# Patient Record
Sex: Female | Born: 1985 | Race: White | Hispanic: No | Smoking: Former smoker
Health system: Southern US, Community
[De-identification: ages and names within clinical notes are randomized; demographics above are authoritative.]

## PROBLEM LIST (undated history)

## (undated) ENCOUNTER — Inpatient Hospital Stay (HOSPITAL_COMMUNITY): Payer: Self-pay

## (undated) DIAGNOSIS — N201 Calculus of ureter: Secondary | ICD-10-CM

## (undated) DIAGNOSIS — R3915 Urgency of urination: Secondary | ICD-10-CM

## (undated) DIAGNOSIS — Z8619 Personal history of other infectious and parasitic diseases: Secondary | ICD-10-CM

## (undated) DIAGNOSIS — G43909 Migraine, unspecified, not intractable, without status migrainosus: Secondary | ICD-10-CM

## (undated) DIAGNOSIS — K219 Gastro-esophageal reflux disease without esophagitis: Secondary | ICD-10-CM

## (undated) HISTORY — DX: Migraine, unspecified, not intractable, without status migrainosus: G43.909

## (undated) HISTORY — PX: WISDOM TOOTH EXTRACTION: SHX21

---
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
## 2008-01-09 ENCOUNTER — Emergency Department (HOSPITAL_COMMUNITY): Admission: EM | Admit: 2008-01-09 | Discharge: 2008-01-09 | Payer: Self-pay | Admitting: Emergency Medicine

## 2008-03-24 ENCOUNTER — Emergency Department (HOSPITAL_COMMUNITY): Admission: EM | Admit: 2008-03-24 | Discharge: 2008-03-24 | Payer: Self-pay | Admitting: Emergency Medicine

## 2010-02-16 ENCOUNTER — Emergency Department (HOSPITAL_COMMUNITY)
Admission: EM | Admit: 2010-02-16 | Discharge: 2010-02-16 | Payer: Self-pay | Source: Home / Self Care | Admitting: Emergency Medicine

## 2010-02-16 LAB — URINALYSIS, ROUTINE W REFLEX MICROSCOPIC
Nitrite: NEGATIVE
Specific Gravity, Urine: 1.011 (ref 1.005–1.030)
pH: 7 (ref 5.0–8.0)

## 2010-05-05 LAB — DIFFERENTIAL
Eosinophils Absolute: 0.1 10*3/uL (ref 0.0–0.7)
Monocytes Absolute: 0.4 10*3/uL (ref 0.1–1.0)
Monocytes Relative: 6 % (ref 3–12)
Neutro Abs: 4.4 10*3/uL (ref 1.7–7.7)
Neutrophils Relative %: 67 % (ref 43–77)

## 2010-05-05 LAB — COMPREHENSIVE METABOLIC PANEL
Albumin: 3.7 g/dL (ref 3.5–5.2)
Alkaline Phosphatase: 105 U/L (ref 39–117)
BUN: 6 mg/dL (ref 6–23)
CO2: 24 mEq/L (ref 19–32)
Calcium: 9.2 mg/dL (ref 8.4–10.5)
Chloride: 109 mEq/L (ref 96–112)
Creatinine, Ser: 0.56 mg/dL (ref 0.4–1.2)
GFR calc Af Amer: >60 mL/min (ref >60)
Glucose, Bld: 91 mg/dL (ref 70–99)

## 2010-05-05 LAB — CBC
HCT: 41.5 % (ref 36.0–46.0)
MCV: 89.5 fL (ref 78.0–100.0)
Platelets: 323 10*3/uL (ref 150–400)
RDW: 12.4 % (ref 11.5–15.5)

## 2010-05-05 LAB — LIPASE, BLOOD: Lipase: 32 U/L (ref 11–59)

## 2010-06-10 ENCOUNTER — Other Ambulatory Visit: Payer: Self-pay | Admitting: Obstetrics and Gynecology

## 2010-06-10 ENCOUNTER — Ambulatory Visit
Admission: RE | Admit: 2010-06-10 | Discharge: 2010-06-10 | Disposition: A | Discharge status: Home / Self Care | Payer: PRIVATE HEALTH INSURANCE | Source: Ambulatory Visit | Attending: Obstetrics and Gynecology | Admitting: Obstetrics and Gynecology

## 2010-08-01 ENCOUNTER — Emergency Department (HOSPITAL_COMMUNITY)
Admission: EM | Admit: 2010-08-01 | Discharge: 2010-08-01 | Disposition: A | Discharge status: Home / Self Care | Payer: PRIVATE HEALTH INSURANCE | Attending: Emergency Medicine | Admitting: Emergency Medicine

## 2010-08-01 ENCOUNTER — Emergency Department (HOSPITAL_COMMUNITY): Payer: PRIVATE HEALTH INSURANCE

## 2010-08-01 DIAGNOSIS — R109 Unspecified abdominal pain: Secondary | ICD-10-CM | POA: Insufficient documentation

## 2010-08-01 DIAGNOSIS — M549 Dorsalgia, unspecified: Secondary | ICD-10-CM | POA: Insufficient documentation

## 2010-08-01 DIAGNOSIS — Z87442 Personal history of urinary calculi: Secondary | ICD-10-CM | POA: Insufficient documentation

## 2010-08-01 DIAGNOSIS — R112 Nausea with vomiting, unspecified: Secondary | ICD-10-CM | POA: Insufficient documentation

## 2010-08-01 LAB — URINALYSIS, ROUTINE W REFLEX MICROSCOPIC
Leukocytes, UA: NEGATIVE
Nitrite: NEGATIVE
Protein, ur: NEGATIVE mg/dL
pH: 5.5 (ref 5.0–8.0)

## 2010-08-01 LAB — CBC
Hemoglobin: 12.8 g/dL (ref 12.0–15.0)
MCH: 30.2 pg (ref 26.0–34.0)
MCHC: 34.1 g/dL (ref 30.0–36.0)
RBC: 4.24 MIL/uL (ref 3.87–5.11)
RDW: 12.6 % (ref 11.5–15.5)
WBC: 10.1 10*3/uL (ref 4.0–10.5)

## 2010-08-01 LAB — DIFFERENTIAL
Eosinophils Absolute: 0.4 10*3/uL (ref 0.0–0.7)
Lymphs Abs: 1.5 10*3/uL (ref 0.7–4.0)
Neutro Abs: 7.8 10*3/uL — ABNORMAL HIGH (ref 1.7–7.7)

## 2010-08-01 LAB — BASIC METABOLIC PANEL
BUN: 8 mg/dL (ref 6–23)
CO2: 24 mEq/L (ref 19–32)
GFR calc Af Amer: >60 mL/min (ref >60)
Potassium: 3.6 mEq/L (ref 3.5–5.1)
Sodium: 137 mEq/L (ref 135–145)

## 2010-11-18 ENCOUNTER — Other Ambulatory Visit (HOSPITAL_COMMUNITY): Payer: Self-pay | Admitting: Gastroenterology

## 2010-11-18 DIAGNOSIS — R1084 Generalized abdominal pain: Secondary | ICD-10-CM

## 2010-11-18 DIAGNOSIS — R110 Nausea: Secondary | ICD-10-CM

## 2010-12-12 ENCOUNTER — Other Ambulatory Visit (HOSPITAL_COMMUNITY): Payer: PRIVATE HEALTH INSURANCE

## 2010-12-19 ENCOUNTER — Encounter (HOSPITAL_COMMUNITY)
Admission: RE | Admit: 2010-12-19 | Discharge: 2010-12-19 | Disposition: A | Discharge status: Home / Self Care | Payer: PRIVATE HEALTH INSURANCE | Source: Ambulatory Visit | Attending: Gastroenterology | Admitting: Gastroenterology

## 2010-12-19 DIAGNOSIS — R110 Nausea: Secondary | ICD-10-CM | POA: Insufficient documentation

## 2010-12-19 DIAGNOSIS — R1084 Generalized abdominal pain: Secondary | ICD-10-CM | POA: Insufficient documentation

## 2010-12-19 MED ORDER — TECHNETIUM TC 99M MEBROFENIN IV KIT
5.0000 | PACK | Freq: Once | INTRAVENOUS | Status: AC | PRN
  Administered 2010-12-19: 5 via INTRAVENOUS

## 2010-12-19 MED ORDER — SINCALIDE 5 MCG IJ SOLR
INTRAMUSCULAR | Status: AC
  Administered 2010-12-19: 1.45 ug via INTRAVENOUS
  Filled 2010-12-19: qty 5

## 2010-12-26 HISTORY — PX: COLONOSCOPY: SHX174

## 2010-12-26 HISTORY — PX: UPPER GASTROINTESTINAL ENDOSCOPY: SHX188

## 2010-12-27 ENCOUNTER — Observation Stay (HOSPITAL_COMMUNITY)
Admission: EM | Admit: 2010-12-27 | Discharge: 2010-12-29 | Disposition: A | Discharge status: Home / Self Care | Payer: PRIVATE HEALTH INSURANCE | Attending: General Surgery | Admitting: General Surgery

## 2010-12-27 ENCOUNTER — Encounter: Payer: Self-pay | Admitting: *Deleted

## 2010-12-27 ENCOUNTER — Telehealth (INDEPENDENT_AMBULATORY_CARE_PROVIDER_SITE_OTHER): Payer: Self-pay | Admitting: General Surgery

## 2010-12-27 ENCOUNTER — Emergency Department (HOSPITAL_COMMUNITY): Payer: PRIVATE HEALTH INSURANCE

## 2010-12-27 DIAGNOSIS — F411 Generalized anxiety disorder: Secondary | ICD-10-CM | POA: Insufficient documentation

## 2010-12-27 DIAGNOSIS — Z87442 Personal history of urinary calculi: Secondary | ICD-10-CM | POA: Insufficient documentation

## 2010-12-27 DIAGNOSIS — K828 Other specified diseases of gallbladder: Secondary | ICD-10-CM | POA: Diagnosis present

## 2010-12-27 DIAGNOSIS — R110 Nausea: Secondary | ICD-10-CM

## 2010-12-27 DIAGNOSIS — R109 Unspecified abdominal pain: Secondary | ICD-10-CM | POA: Insufficient documentation

## 2010-12-27 DIAGNOSIS — Z87891 Personal history of nicotine dependence: Secondary | ICD-10-CM | POA: Insufficient documentation

## 2010-12-27 DIAGNOSIS — Z9889 Other specified postprocedural states: Secondary | ICD-10-CM

## 2010-12-27 DIAGNOSIS — R1011 Right upper quadrant pain: Secondary | ICD-10-CM

## 2010-12-27 DIAGNOSIS — K811 Chronic cholecystitis: Principal | ICD-10-CM | POA: Insufficient documentation

## 2010-12-27 LAB — COMPREHENSIVE METABOLIC PANEL
AST: 22 U/L (ref 0–37)
CO2: 23 mEq/L (ref 19–32)
Chloride: 106 mEq/L (ref 96–112)
Creatinine, Ser: 0.67 mg/dL (ref 0.50–1.10)
GFR calc Af Amer: >90 mL/min (ref >90)
GFR calc non Af Amer: >90 mL/min (ref >90)
Glucose, Bld: 90 mg/dL (ref 70–99)
Total Bilirubin: 0.4 mg/dL (ref 0.3–1.2)

## 2010-12-27 LAB — CBC
MCH: 30.5 pg (ref 26.0–34.0)
MCV: 88.2 fL (ref 78.0–100.0)
Platelets: 271 10*3/uL (ref 150–400)
RDW: 12.5 % (ref 11.5–15.5)
WBC: 8.1 10*3/uL (ref 4.0–10.5)

## 2010-12-27 LAB — DIFFERENTIAL
Basophils Absolute: 0 10*3/uL (ref 0.0–0.1)
Eosinophils Absolute: 0.4 10*3/uL (ref 0.0–0.7)
Eosinophils Relative: 5 % (ref 0–5)

## 2010-12-27 MED ORDER — SODIUM CHLORIDE 0.9 % IV SOLN
INTRAVENOUS | Status: DC
  Administered 2010-12-27: 09:00:00 via INTRAVENOUS

## 2010-12-27 MED ORDER — ONDANSETRON HCL 4 MG/2ML IJ SOLN
4.0000 mg | Freq: Four times a day (QID) | INTRAMUSCULAR | Status: DC | PRN
  Administered 2010-12-27 – 2010-12-28 (×3): 4 mg via INTRAVENOUS
  Filled 2010-12-27 (×2): qty 2

## 2010-12-27 MED ORDER — HYDROMORPHONE HCL PF 2 MG/ML IJ SOLN
INTRAMUSCULAR | Status: AC
  Filled 2010-12-27: qty 1

## 2010-12-27 MED ORDER — OXYCODONE-ACETAMINOPHEN 5-325 MG PO TABS: 2.0000 | ORAL_TABLET | ORAL | Status: DC | PRN

## 2010-12-27 MED ORDER — PROMETHAZINE HCL 25 MG/ML IJ SOLN
25.0000 mg | Freq: Four times a day (QID) | INTRAMUSCULAR | Status: DC | PRN
  Administered 2010-12-27: 25 mg via INTRAVENOUS
  Filled 2010-12-27 (×2): qty 1

## 2010-12-27 MED ORDER — ONDANSETRON HCL 4 MG/2ML IJ SOLN
INTRAMUSCULAR | Status: AC
  Filled 2010-12-27: qty 2

## 2010-12-27 MED ORDER — KCL IN DEXTROSE-NACL 20-5-0.45 MEQ/L-%-% IV SOLN
INTRAVENOUS | Status: DC
  Filled 2010-12-27 (×3): qty 1000

## 2010-12-27 MED ORDER — FENTANYL CITRATE 0.05 MG/ML IJ SOLN
100.0000 ug | Freq: Once | INTRAMUSCULAR | Status: AC
  Administered 2010-12-27: 100 ug via INTRAVENOUS
  Filled 2010-12-27: qty 2

## 2010-12-27 MED ORDER — HYDROMORPHONE HCL PF 1 MG/ML IJ SOLN
0.5000 mg | INTRAMUSCULAR | Status: DC | PRN
  Administered 2010-12-27: 0.5 mg via INTRAVENOUS
  Administered 2010-12-27: 1 mg via INTRAVENOUS
  Administered 2010-12-28: 0.5 mg via INTRAVENOUS

## 2010-12-27 MED ORDER — FENTANYL CITRATE 0.05 MG/ML IJ SOLN
50.0000 ug | Freq: Once | INTRAMUSCULAR | Status: AC
  Administered 2010-12-27: 50 ug via INTRAVENOUS
  Filled 2010-12-27: qty 2

## 2010-12-27 MED ORDER — SODIUM CHLORIDE 0.9 % IV BOLUS (SEPSIS)
500.0000 mL | Freq: Once | INTRAVENOUS | Status: AC
  Administered 2010-12-27: 500 mL via INTRAVENOUS

## 2010-12-27 MED ORDER — ONDANSETRON HCL 4 MG/2ML IJ SOLN
4.0000 mg | Freq: Once | INTRAMUSCULAR | Status: AC
  Administered 2010-12-27: 4 mg via INTRAVENOUS
  Filled 2010-12-27: qty 2

## 2010-12-27 NOTE — Telephone Encounter (Signed)
Rosey Bath called in to confirm daughter's appt for tomorrow with Dr. Biagio Quint.

## 2010-12-27 NOTE — ED Notes (Signed)
Patient denies pain and is resting comfortably.  

## 2010-12-27 NOTE — ED Provider Notes (Signed)
History     CSN: [id number] Arrival date & time: 12/27/2010  7:29 AM   First MD Initiated Contact with Patient 12/27/10 [phone]      Chief Complaint  Patient presents with  . Abdominal Pain    (Consider location/radiation/quality/duration/timing/severity/associated sxs/prior treatment) HPI Pt's mother states "she had endo & colonscopy yesterday, has an appt with a surgeon tomorrow for consultation of gallbladder removal d/t only functioning @ 6%.  Patient denies fever does have severe nausea and pain primarily in the right upper quadrant with some referral to the back area.  Patient has had no vomiting of blood or blood in the stools.  Past Medical History  Diagnosis Date  . Kidney stones     History reviewed. No pertinent past surgical history.  No family history on file.  History  Substance Use Topics  . Smoking status: Former Games developer  . Smokeless tobacco: Not on file  . Alcohol Use: No    OB History    Grav Para Term Preterm Abortions TAB SAB Ect Mult Living                  Review of Systems  All other systems reviewed and are negative.    Allergies  Augmentin; Morphine and related; and Penicillins  Home Medications   Current Outpatient Rx  Name Route Sig Dispense Refill  . CITALOPRAM HYDROBROMIDE 20 MG PO TABS Oral Take 20 mg by mouth daily.      . DEXLANSOPRAZOLE 60 MG PO CPDR Oral Take 60 mg by mouth daily.      Marland Kitchen LORATADINE 10 MG PO TABS Oral Take 10 mg by mouth daily.      Marland Kitchen NORGESTIMATE-ETH ESTRADIOL 0.25-35 MG-MCG PO TABS Oral Take 1 tablet by mouth daily.        BP 107/77  Pulse 72  Temp(Src) 97.8 F (36.6 C) (Oral)  Resp 16  Wt 155 lb (70.308 kg)  SpO2 100%  LMP 12/06/2010  Physical Exam  Nursing note and vitals reviewed. Constitutional: She is oriented to person, place, and time. She appears well-developed and well-nourished. No distress.  HENT:  Head: Normocephalic and atraumatic.  Eyes: Pupils are equal, round, and reactive to light.  No scleral icterus.  Neck: Normal range of motion.  Cardiovascular: Normal rate and intact distal pulses.   Pulmonary/Chest: No respiratory distress.  Abdominal: Normal appearance. She exhibits no distension. There is tenderness.    Musculoskeletal: Normal range of motion.  Neurological: She is alert and oriented to person, place, and time. No cranial nerve deficit.  Skin: Skin is warm and dry. No rash noted. She is not diaphoretic.  Psychiatric: She has a normal mood and affect. Her behavior is normal.    ED Course  Procedures (including critical care time) Patient was given IV fluids and pain and antinausea medicine had some improvement in her symptoms.  Family wanted her evaluated by surgery.  Surgery contacted and they will come to evaluate him probably admit patient from the ED  Labs Reviewed  COMPREHENSIVE METABOLIC PANEL  LIPASE, BLOOD  CBC  DIFFERENTIAL   Dg Chest 1 View  12/27/2010  *RADIOLOGY REPORT*  Clinical Data: Upper abdominal pain and gas following GI procedure yesterday.  CHEST - 1 VIEW  Comparison:  Two-view chest radiograph 03/24/2008 at [facility].  Findings:  The heart size is normal.  The lungs are clear.  The visualized soft tissues and bony thorax are unremarkable.  There is no free air under the diaphragm.  IMPRESSION: Negative chest.  Original Report Authenticated By: Jamesetta Orleans. MATTERN, M.D.     No diagnosis found.    MDM          Nelia Shi, MD 12/27/10 1016

## 2010-12-27 NOTE — H&P (Signed)
Seen, agree with above. Pt with biliary dyskinesia.  Plan lap chole as schedule allows.

## 2010-12-27 NOTE — ED Notes (Signed)
Pt's mother states "she had endo & colonscopy yesterday, has an appt with a surgeon tomorrow for consultation of gallbladder removal d/t only functioning @ 6%"

## 2010-12-27 NOTE — H&P (Signed)
Lori [name] 1985/06/23  [id number] Chief Complaint/Reason for Consult: abd pain with nausea and vomiting over 1 year  HPI: this is a 25 yo female who has had nausea and vomiting for over a year now.  She has had multiple ED visits with CT scans and abdominal U/S.  These have all been negative.  Her ultrasound showed no gallstones or gallbladder wall thickening.  She was sent to see Dr. Loreta Ave with GI.  She had a HIDA scan done a couple of weeks ago that revealed biliary dyskinesia with and ejection fraction of 6%.  She was schedule to see Dr. Lodema Pilot in our office tomorrow.  He requested an endoscopy prior to her visit.  She had this and a colonoscopy yesterday.  Today she woke up with RUQ abdominal pain.  She has never had pain before with her gallbladder, just N/V.  She spoke with Dr. Loreta Ave who told her to come to the ED.  The patient's mother called our office and requested we see the patient while she was in the ED.  Review of Systems: Please see HPI, otherwise all other systems have been reviewed and are negative.  History reviewed. No pertinent family history.  Past Medical History  Diagnosis Date  . Kidney stones   . Anxiety     Past Surgical History  Procedure Date  . Wisdom tooth extraction   . Colonoscopy 12/26/2010  . Upper gastrointestinal endoscopy 12/26/2010    Social History:  reports that she has quit smoking. Her smoking use included Cigarettes. She quit smokeless tobacco use about 2 years ago. She reports that she uses illicit drugs (Marijuana). She reports that she does not drink alcohol.  Allergies:  Allergies  Allergen Reactions  . Augmentin Diarrhea  . Morphine And Related Other (See Comments)    Shakes,cold chills  . Penicillins Rash and Other (See Comments)    fever    Medications Prior to Admission  Medication Dose Route Frequency Provider Last Rate Last Dose  . 0.9 %  sodium chloride infusion   Intravenous Continuous Nelia Shi, MD 125 mL/hr at  12/27/10 0911    . fentaNYL (SUBLIMAZE) injection 100 mcg  100 mcg Intravenous Once Nelia Shi, MD   100 mcg at 12/27/10 0839  . fentaNYL (SUBLIMAZE) injection 50 mcg  50 mcg Intravenous Once Nelia Shi, MD   50 mcg at 12/27/10 1015  . ondansetron (ZOFRAN) injection 4 mg  4 mg Intravenous Once Nelia Shi, MD   4 mg at 12/27/10 0839  . sodium chloride 0.9 % bolus 500 mL  500 mL Intravenous Once Nelia Shi, MD   500 mL at 12/27/10 0839   No current outpatient prescriptions on file as of 12/27/2010.    Blood pressure 107/77, pulse 72, temperature 97.8 F (36.6 C), temperature source Oral, resp. rate 16, weight 155 lb (70.308 kg), last menstrual period 12/06/2010, SpO2 100.00%. Physical Exam: General: pleasant 25 yo WD, WN laying in bed in NAD HEENT: head is normocephalic, atraumatic, sclera non injected.  PERRL, mouth is pink Heart: regular, rate, and rhythm.  Normal s1s2. No murmurs, gallops, or rubs.  Palpable radial and pedal pulses bilaterally Lungs: CTAB, no wheezes, rhonchi, or rales.  Respiratory effort is nonlabored. Abd: soft, mild RUQ tenderness, no guarding or peritoneal signs.  +BS, ND, no masses or hernias MS: all 4 extremities are symmetrical with no cyanosis, clubbing, or edema Skin: warm and dry.  No masses, lesions, or rashes Psych:  A&Ox3.  Appropriate affect.   Results for orders placed during the hospital encounter of 12/27/10 (from the past 48 hour(s))  COMPREHENSIVE METABOLIC PANEL     Status: Normal   Collection Time   12/27/10  8:17 AM      Component Value Range Comment   Sodium 138  135 - 145 (mEq/L)    Potassium 3.8  3.5 - 5.1 (mEq/L)    Chloride 106  96 - 112 (mEq/L)    CO2 23  19 - 32 (mEq/L)    Glucose, Bld 90  70 - 99 (mg/dL)    BUN 8  6 - 23 (mg/dL)    Creatinine, Ser 1.61  0.50 - 1.10 (mg/dL)    Calcium 9.5  8.4 - 10.5 (mg/dL)    Total Protein 6.8  6.0 - 8.3 (g/dL)    Albumin 3.7  3.5 - 5.2 (g/dL)    AST 22  0 - 37 (U/L)    ALT 21  0  - 35 (U/L)    Alkaline Phosphatase 71  39 - 117 (U/L)    Total Bilirubin 0.4  0.3 - 1.2 (mg/dL)    GFR calc non Af Amer >90  >90 (mL/min)    GFR calc Af Amer >90  >90 (mL/min)   LIPASE, BLOOD     Status: Normal   Collection Time   12/27/10  8:17 AM      Component Value Range Comment   Lipase 50  11 - 59 (U/L)   CBC     Status: Normal   Collection Time   12/27/10  8:17 AM      Component Value Range Comment   WBC 8.1  4.0 - 10.5 (K/uL)    RBC 4.66  3.87 - 5.11 (MIL/uL)    Hemoglobin 14.2  12.0 - 15.0 (g/dL)    HCT 09.6  04.5 - 40.9 (%)    MCV 88.2  78.0 - 100.0 (fL)    MCH 30.5  26.0 - 34.0 (pg)    MCHC 34.5  30.0 - 36.0 (g/dL)    RDW 81.1  91.4 - 78.2 (%)    Platelets 271  150 - 400 (K/uL)   DIFFERENTIAL     Status: Normal   Collection Time   12/27/10  8:17 AM      Component Value Range Comment   Neutrophils Relative 57  43 - 77 (%)    Neutro Abs 4.7  1.7 - 7.7 (K/uL)    Lymphocytes Relative 29  12 - 46 (%)    Lymphs Abs 2.4  0.7 - 4.0 (K/uL)    Monocytes Relative 8  3 - 12 (%)    Monocytes Absolute 0.6  0.1 - 1.0 (K/uL)    Eosinophils Relative 5  0 - 5 (%)    Eosinophils Absolute 0.4  0.0 - 0.7 (K/uL)    Basophils Relative 1  0 - 1 (%)    Basophils Absolute 0.0  0.0 - 0.1 (K/uL)    Dg Chest 1 View  12/27/2010  *RADIOLOGY REPORT*  Clinical Data: Upper abdominal pain and gas following GI procedure yesterday.  CHEST - 1 VIEW  Comparison:  Two-view chest radiograph 03/24/2008 at [facility].  Findings:  The heart size is normal.  The lungs are clear.  The visualized soft tissues and bony thorax are unremarkable.  There is no free air under the diaphragm.  IMPRESSION: Negative chest.  Original Report Authenticated By: Jamesetta Orleans. MATTERN, M.D.  Assessment/Plan 1.biliary dyskinesia 2. Mild abdominal pain, s/p colonoscopy, endoscopy 3. Anxiety  Plan: I explained to the patient and her family, who were all in the room. that I did not suspect that her new  abdominal pain was likely related to her gallbladder.  She has never had pain, just n/v.  She had colo/endo yesterday and the likelihood is that her abdominal pain may be residual gas as her CXR was negative for free air.  I d/w pt and family that she does need her gallbladder out as she does have a low ejection fraction and I feel this may help her nausea and vomiting.  I explained this is not an emergency and that she could go home and follow up with Dr. Biagio Quint if she chose to.  Her mother said that she would rather, since the patient is here, to go ahead and have her gallbladder taken out now.  The patient then agreed with her mother.  I told them we could do this, but to be aware that this was not an emergent or urgent procedure and that if other cases came in she may get bumped, even as late as Thursday or Friday.  They understood and still wished to be admitted.  I will give her clear liquids for now then NPO after MN for possible surgery tomorrow.  We will start IVFs and various prn medications.  Yuepheng Schaller E 12/27/2010, 12:03 PM

## 2010-12-28 ENCOUNTER — Other Ambulatory Visit (HOSPITAL_COMMUNITY): Payer: PRIVATE HEALTH INSURANCE

## 2010-12-28 ENCOUNTER — Emergency Department (HOSPITAL_COMMUNITY): Payer: PRIVATE HEALTH INSURANCE | Admitting: Anesthesiology

## 2010-12-28 ENCOUNTER — Other Ambulatory Visit (INDEPENDENT_AMBULATORY_CARE_PROVIDER_SITE_OTHER): Payer: Self-pay | Admitting: General Surgery

## 2010-12-28 ENCOUNTER — Encounter (HOSPITAL_COMMUNITY): Payer: Self-pay | Admitting: Anesthesiology

## 2010-12-28 ENCOUNTER — Ambulatory Visit (INDEPENDENT_AMBULATORY_CARE_PROVIDER_SITE_OTHER): Payer: Self-pay | Admitting: General Surgery

## 2010-12-28 ENCOUNTER — Encounter (HOSPITAL_COMMUNITY): Admission: EM | Disposition: A | Discharge status: Home / Self Care | Payer: Self-pay | Source: Home / Self Care

## 2010-12-28 ENCOUNTER — Encounter (HOSPITAL_COMMUNITY): Payer: Self-pay | Admitting: General Surgery

## 2010-12-28 DIAGNOSIS — K811 Chronic cholecystitis: Secondary | ICD-10-CM

## 2010-12-28 HISTORY — PX: CHOLECYSTECTOMY: SHX55

## 2010-12-28 SURGERY — LAPAROSCOPIC CHOLECYSTECTOMY
Anesthesia: General | Site: Abdomen | Wound class: Contaminated

## 2010-12-28 MED ORDER — PANTOPRAZOLE SODIUM 40 MG IV SOLR
40.0000 mg | Freq: Every day | INTRAVENOUS | Status: DC
  Administered 2010-12-28: 40 mg via INTRAVENOUS
  Filled 2010-12-28 (×2): qty 40

## 2010-12-28 MED ORDER — IOHEXOL 300 MG/ML  SOLN
INTRAMUSCULAR | Status: AC
  Filled 2010-12-28: qty 1

## 2010-12-28 MED ORDER — FENTANYL CITRATE 0.05 MG/ML IJ SOLN
25.0000 ug | INTRAMUSCULAR | Status: DC | PRN
  Administered 2010-12-28: 50 ug via INTRAVENOUS
  Administered 2010-12-28 (×2): 25 ug via INTRAVENOUS

## 2010-12-28 MED ORDER — CISATRACURIUM BESYLATE 2 MG/ML IV SOLN
INTRAVENOUS | Status: DC | PRN
  Administered 2010-12-28: 8 mg via INTRAVENOUS

## 2010-12-28 MED ORDER — LACTATED RINGERS IV SOLN: INTRAVENOUS | Status: DC

## 2010-12-28 MED ORDER — MIDAZOLAM HCL 5 MG/5ML IJ SOLN
INTRAMUSCULAR | Status: DC | PRN
  Administered 2010-12-28: 2 mg via INTRAVENOUS

## 2010-12-28 MED ORDER — FENTANYL CITRATE 0.05 MG/ML IJ SOLN
INTRAMUSCULAR | Status: DC | PRN
  Administered 2010-12-28: 50 ug via INTRAVENOUS
  Administered 2010-12-28: 100 ug via INTRAVENOUS
  Administered 2010-12-28: 25 ug via INTRAVENOUS

## 2010-12-28 MED ORDER — FENTANYL CITRATE 0.05 MG/ML IJ SOLN
INTRAMUSCULAR | Status: AC
  Administered 2010-12-28: 100 ug via INTRAVENOUS
  Filled 2010-12-28: qty 2

## 2010-12-28 MED ORDER — MIDAZOLAM HCL 2 MG/2ML IJ SOLN
1.0000 mg | INTRAMUSCULAR | Status: DC | PRN
  Administered 2010-12-28: 1 mg via INTRAVENOUS

## 2010-12-28 MED ORDER — PROMETHAZINE HCL 25 MG/ML IJ SOLN
12.5000 mg | INTRAMUSCULAR | Status: DC | PRN
  Administered 2010-12-28 – 2010-12-29 (×2): 12.5 mg via INTRAVENOUS
  Filled 2010-12-28 (×2): qty 1

## 2010-12-28 MED ORDER — HYDROCODONE-ACETAMINOPHEN 5-325 MG PO TABS
1.0000 | ORAL_TABLET | ORAL | Status: DC | PRN
  Administered 2010-12-29: 2 via ORAL
  Filled 2010-12-28: qty 2

## 2010-12-28 MED ORDER — HYDROMORPHONE HCL PF 2 MG/ML IJ SOLN
INTRAMUSCULAR | Status: AC
  Filled 2010-12-28: qty 1

## 2010-12-28 MED ORDER — LACTATED RINGERS IV SOLN
INTRAVENOUS | Status: DC
  Administered 2010-12-28 – 2010-12-29 (×2): via INTRAVENOUS

## 2010-12-28 MED ORDER — LACTATED RINGERS IV SOLN
INTRAVENOUS | Status: DC
  Administered 2010-12-28: 1000 mL via INTRAVENOUS

## 2010-12-28 MED ORDER — HYDROMORPHONE HCL PF 2 MG/ML IJ SOLN
0.5000 mg | INTRAMUSCULAR | Status: DC | PRN
  Administered 2010-12-28 (×2): 0.5 mg via INTRAVENOUS

## 2010-12-28 MED ORDER — CIPROFLOXACIN IN D5W 400 MG/200ML IV SOLN
400.0000 mg | INTRAVENOUS | Status: AC
  Administered 2010-12-28: 400 mg via INTRAVENOUS

## 2010-12-28 MED ORDER — PROPOFOL 10 MG/ML IV EMUL
INTRAVENOUS | Status: DC | PRN
  Administered 2010-12-28: 150 mg via INTRAVENOUS

## 2010-12-28 MED ORDER — DIPHENHYDRAMINE HCL 12.5 MG/5ML PO ELIX: 12.5000 mg | ORAL_SOLUTION | Freq: Four times a day (QID) | ORAL | Status: DC | PRN

## 2010-12-28 MED ORDER — PROMETHAZINE HCL 25 MG/ML IJ SOLN
INTRAMUSCULAR | Status: AC
  Filled 2010-12-28: qty 1

## 2010-12-28 MED ORDER — SODIUM CHLORIDE 0.9 % IR SOLN
Status: DC | PRN
  Administered 2010-12-28: 1000 mL

## 2010-12-28 MED ORDER — LACTATED RINGERS IR SOLN
Status: DC | PRN
  Administered 2010-12-28: 1000 mL

## 2010-12-28 MED ORDER — HYDROMORPHONE HCL PF 1 MG/ML IJ SOLN
INTRAMUSCULAR | Status: AC
  Filled 2010-12-28: qty 1

## 2010-12-28 MED ORDER — BUPIVACAINE LIPOSOME 1.3 % IJ SUSP
20.0000 mL | Freq: Once | INTRAMUSCULAR | Status: DC
  Filled 2010-12-28: qty 20

## 2010-12-28 MED ORDER — LACTATED RINGERS IV SOLN
INTRAVENOUS | Status: DC | PRN
  Administered 2010-12-28: 12:00:00 via INTRAVENOUS

## 2010-12-28 MED ORDER — PROMETHAZINE HCL 25 MG/ML IJ SOLN
6.2500 mg | INTRAMUSCULAR | Status: DC | PRN
  Administered 2010-12-28: 6.25 mg via INTRAVENOUS
  Filled 2010-12-28: qty 1

## 2010-12-28 MED ORDER — DIPHENHYDRAMINE HCL 50 MG/ML IJ SOLN: 12.5000 mg | Freq: Four times a day (QID) | INTRAMUSCULAR | Status: DC | PRN

## 2010-12-28 MED ORDER — CIPROFLOXACIN IN D5W 400 MG/200ML IV SOLN
INTRAVENOUS | Status: AC
  Filled 2010-12-28: qty 200

## 2010-12-28 MED ORDER — FENTANYL CITRATE 0.05 MG/ML IJ SOLN
25.0000 ug | INTRAMUSCULAR | Status: DC | PRN
  Administered 2010-12-28 (×2): 25 ug via INTRAVENOUS
  Administered 2010-12-28: 100 ug via INTRAVENOUS
  Filled 2010-12-28 (×2): qty 2

## 2010-12-28 MED ORDER — GLYCOPYRROLATE 0.2 MG/ML IJ SOLN
INTRAMUSCULAR | Status: DC | PRN
  Administered 2010-12-28: .6 mg via INTRAVENOUS

## 2010-12-28 MED ORDER — NEOSTIGMINE METHYLSULFATE 1 MG/ML IJ SOLN
INTRAMUSCULAR | Status: DC | PRN
  Administered 2010-12-28: 4 mg via INTRAVENOUS

## 2010-12-28 MED ORDER — NORGESTIMATE-ETH ESTRADIOL 0.25-35 MG-MCG PO TABS: 1.0000 | ORAL_TABLET | Freq: Every day | ORAL | Status: DC

## 2010-12-28 MED ORDER — CITALOPRAM HYDROBROMIDE 20 MG PO TABS
20.0000 mg | ORAL_TABLET | ORAL | Status: DC
  Filled 2010-12-28 (×4): qty 1

## 2010-12-28 MED ORDER — EPHEDRINE SULFATE 50 MG/ML IJ SOLN
INTRAMUSCULAR | Status: DC | PRN
  Administered 2010-12-28: 7.5 mg via INTRAVENOUS

## 2010-12-28 MED ORDER — BUPIVACAINE LIPOSOME 1.3 % IJ SUSP
INTRAMUSCULAR | Status: DC | PRN
  Administered 2010-12-28: 18 mL

## 2010-12-28 MED ORDER — DEXAMETHASONE SODIUM PHOSPHATE 10 MG/ML IJ SOLN
INTRAMUSCULAR | Status: DC | PRN
  Administered 2010-12-28: 10 mg via INTRAVENOUS

## 2010-12-28 MED ORDER — ONDANSETRON HCL 4 MG/2ML IJ SOLN
INTRAMUSCULAR | Status: DC | PRN
  Administered 2010-12-28: 4 mg via INTRAVENOUS

## 2010-12-28 MED ORDER — MIDAZOLAM HCL 5 MG/ML IJ SOLN
INTRAMUSCULAR | Status: AC
  Filled 2010-12-28: qty 1

## 2010-12-28 SURGICAL SUPPLY — 45 items
ADH SKN CLS APL DERMABOND .7 (GAUZE/BANDAGES/DRESSINGS) ×2
APL SKNCLS STERI-STRIP NONHPOA (GAUZE/BANDAGES/DRESSINGS)
APPLIER CLIP 5 13 M/L LIGAMAX5 (MISCELLANEOUS)
APPLIER CLIP ROT 10 11.4 M/L (STAPLE) ×2
APR CLP MED LRG 11.4X10 (STAPLE) ×1
APR CLP MED LRG 5 ANG JAW (MISCELLANEOUS)
BAG SPEC RTRVL LRG 6X4 10 (ENDOMECHANICALS) ×1
BENZOIN TINCTURE PRP APPL 2/3 (GAUZE/BANDAGES/DRESSINGS) IMPLANT
CANISTER SUCTION 2500CC (MISCELLANEOUS) ×2 IMPLANT
CLIP APPLIE 5 13 M/L LIGAMAX5 (MISCELLANEOUS) ×1 IMPLANT
CLIP APPLIE ROT 10 11.4 M/L (STAPLE) IMPLANT
CLOTH BEACON ORANGE TIMEOUT ST (SAFETY) ×2 IMPLANT
COVER MAYO STAND STRL (DRAPES) ×1 IMPLANT
DECANTER SPIKE VIAL GLASS SM (MISCELLANEOUS) ×2 IMPLANT
DERMABOND ADVANCED (GAUZE/BANDAGES/DRESSINGS) ×2
DERMABOND ADVANCED .7 DNX12 (GAUZE/BANDAGES/DRESSINGS) IMPLANT
DRAPE C-ARM 42X72 X-RAY (DRAPES) ×1 IMPLANT
DRAPE LAPAROSCOPIC ABDOMINAL (DRAPES) ×2 IMPLANT
ELECT REM PT RETURN 9FT ADLT (ELECTROSURGICAL) ×2
ELECTRODE REM PT RTRN 9FT ADLT (ELECTROSURGICAL) ×1 IMPLANT
GLOVE BIO SURGEON STRL SZ7 (GLOVE) ×3 IMPLANT
GLOVE BIOGEL PI IND STRL 7.5 (GLOVE) ×1 IMPLANT
GLOVE BIOGEL PI INDICATOR 7.5 (GLOVE) ×1
GLOVE INDICATOR 6.5 STRL GRN (GLOVE) ×1 IMPLANT
GLOVE INDICATOR 8.0 STRL GRN (GLOVE) ×1 IMPLANT
GLOVE SURG SS PI 6.5 STRL IVOR (GLOVE) ×3 IMPLANT
GOWN PREVENTION PLUS LG XLONG (DISPOSABLE) ×5 IMPLANT
GOWN PREVENTION PLUS XLARGE (GOWN DISPOSABLE) ×2 IMPLANT
GOWN STRL NON-REIN LRG LVL3 (GOWN DISPOSABLE) ×2 IMPLANT
GOWN STRL REIN XL XLG (GOWN DISPOSABLE) ×2 IMPLANT
KIT BASIN OR (CUSTOM PROCEDURE TRAY) ×2 IMPLANT
NS IRRIG 1000ML POUR BTL (IV SOLUTION) ×2 IMPLANT
POUCH SPECIMEN RETRIEVAL 10MM (ENDOMECHANICALS) ×2 IMPLANT
SCISSORS LAP 5X35 DISP (ENDOMECHANICALS) ×1 IMPLANT
SET CHOLANGIOGRAPH MIX (MISCELLANEOUS) IMPLANT
SET IRRIG TUBING LAPAROSCOPIC (IRRIGATION / IRRIGATOR) ×2 IMPLANT
SOLUTION ANTI FOG 6CC (MISCELLANEOUS) ×2 IMPLANT
STRIP CLOSURE SKIN 1/2X4 (GAUZE/BANDAGES/DRESSINGS) IMPLANT
SUT MNCRL AB 4-0 PS2 18 (SUTURE) ×3 IMPLANT
TOWEL OR 17X26 10 PK STRL BLUE (TOWEL DISPOSABLE) ×2 IMPLANT
TRAY LAP CHOLE (CUSTOM PROCEDURE TRAY) ×2 IMPLANT
TROCAR BLADELESS OPT 5 75 (ENDOMECHANICALS) ×6 IMPLANT
TROCAR XCEL BLUNT TIP 100MML (ENDOMECHANICALS) ×2 IMPLANT
TROCAR XCEL NON-BLD 11X100MML (ENDOMECHANICALS) IMPLANT
TUBING INSUFFLATION 10FT LAP (TUBING) ×2 IMPLANT

## 2010-12-28 NOTE — Addendum Note (Signed)
Addendum  created 12/28/10 1450 by Gaetano Hawthorne, MD   Modules edited:Orders

## 2010-12-28 NOTE — Interval H&P Note (Signed)
History and Physical Interval Note:  12/28/2010 11:59 AM  Lori [name]  has presented today for surgery, with the diagnosis of biliary dyskinesia.  The various methods of treatment have been discussed with the patient and family. After consideration of risks, benefits and other options for treatment, the patient has consented to  Procedure(s): LAPAROSCOPIC CHOLECYSTECTOMY as a surgical intervention .  The patients' history has been reviewed, patient examined, no change in status, stable for surgery.  I have reviewed the patients' chart and labs.  Questions were answered to the patient's satisfaction.     [name]

## 2010-12-28 NOTE — Op Note (Signed)
Laparoscopic Cholecystectomy Note  Indications: This patient presents with symptomatic gallbladder disease and will undergo laparoscopic cholecystectomy.  Pre-operative Diagnosis: biliary dyskinesia   Post-operative Diagnosis: Same  Surgeon: Almond Lint   Assistants: Herma Mering, PA-Student  Anesthesia: General endotracheal anesthesia and Exparel  ASA Class: 1  Procedure Details  The patient was seen again in the Holding Room. The risks, benefits, complications, treatment options, and expected outcomes were discussed with the patient. The possibilities of  bleeding, recurrent infection, damage to nearby structures, the need for additional procedures, failure to diagnose a condition, the possible need to convert to an open procedure, and creating a complication requiring transfusion or operation were discussed with the patient. The likelihood of improving the patient's symptoms with return to their baseline status is good.  The patient and/or family concurred with the proposed plan, giving informed consent. The site of surgery properly noted. The patient was taken to Operating Room, and the procedure verified as Laparoscopic Cholecystectomy with Intraoperative Cholangiogram. A Time Out was held and the above information confirmed.  Prior to the induction of general anesthesia, antibiotic prophylaxis was administered. General endotracheal anesthesia was then administered and tolerated well. After the induction, the abdomen was prepped with Chloraprep and draped in the sterile fashion. The patient was positioned in the supine position.  Local anesthetic agent was injected into the skin near the umbilicus and an incision made. We dissected down to the abdominal fascia with blunt dissection.  The fascia was incised vertically and we entered the peritoneal cavity bluntly.  A pursestring suture of 0-Vicryl was placed around the fascial opening.  The Hasson cannula was inserted and secured with the  stay suture.  Pneumoperitoneum was then created with CO2 and tolerated well without any adverse changes in the patient's vital signs. An 11-mm port was placed in the subxiphoid position.  Two 5-mm ports were placed in the right upper quadrant. All skin incisions were infiltrated with a local anesthetic agent before making the incision and placing the trocars.   We positioned the patient in reverse Trendelenburg, tilted slightly to the patient's left.  The gallbladder was identified, the fundus grasped and retracted cephalad. Adhesions were lysed bluntly and with the electrocautery where indicated, taking care not to injure any adjacent organs or viscus. The infundibulum was grasped and retracted laterally, exposing the peritoneum overlying the triangle of Calot. This was then divided and exposed in a blunt fashion. A critical view of the cystic duct and cystic artery was obtained.  The cystic duct was clearly identified and bluntly dissected circumferentially. The cystic duct was ligated with a clip distally.   An incision was made in the cystic duct and the [facility] cholangiogram catheter introduced into the abdomen.  The catheter was unable to be threaded in the cystic duct due to the small size of the duct despite multiple attempts. The cholangiogram was aborted.   The cystic duct was then ligated with clips and divided. The cystic artery was identified, dissected free, ligated with clips and divided as well.   The gallbladder was dissected from the liver bed in retrograde fashion with the electrocautery. The gallbladder was removed and placed in an Endocatch sac. The liver bed was irrigated and inspected. Hemostasis was achieved with the electrocautery. Copious irrigation was utilized and was repeatedly aspirated until clear.  The gallbladder and Endocatch sac were then removed through the umbilical port site.     We again inspected the right upper quadrant for hemostasis.  Pneumoperitoneum was released as  we removed the trocars. The pursestring suture was used to close the umbilical fascia. There was no residual palpable fascial defect.   4-0 Monocryl was used to close the skin.   The skin was cleaned and dry, and Dermabond was applied. The patient was then extubated and brought to the recovery room in stable condition. Instrument, sponge, and needle counts were correct at closure and at the conclusion of the case.   Findings: Mild chronic cholecystitis without Cholelithiasis  Estimated Blood Loss: Minimal                Specimens: Gallbladder           Complications: None; patient tolerated the procedure well.         Disposition: PACU - hemodynamically stable.         Condition: stable

## 2010-12-28 NOTE — Transfer of Care (Signed)
Immediate Anesthesia Transfer of Care Note  Patient: Lori [name]  Procedure(s) Performed:  LAPAROSCOPIC CHOLECYSTECTOMY  Patient Location: PACU  Anesthesia Type: General  Level of Consciousness: awake and sedated  Airway & Oxygen Therapy: Patient Spontanous Breathing and Patient connected to face mask oxygen  Post-op Assessment: Report given to PACU RN and Post -op Vital signs reviewed and stable  Post vital signs: Reviewed and stable  Complications: No apparent anesthesia complications

## 2010-12-28 NOTE — Anesthesia Postprocedure Evaluation (Signed)
  Anesthesia Post-op Note  Patient: Lori [name]  Procedure(s) Performed:  LAPAROSCOPIC CHOLECYSTECTOMY  Patient Location: PACU  Anesthesia Type: General  Level of Consciousness: awake and alert   Airway and Oxygen Therapy: Patient Spontanous Breathing  Post-op Pain: mild  Post-op Assessment: Post-op Vital signs reviewed, Patient's Cardiovascular Status Stable, Respiratory Function Stable, Patent Airway and No signs of Nausea or vomiting  Post-op Vital Signs: stable  Complications: No apparent anesthesia complications

## 2010-12-28 NOTE — Anesthesia Preprocedure Evaluation (Addendum)
Anesthesia Evaluation  Patient identified by MRN, date of birth, ID band Patient awake    Reviewed: Allergy & Precautions, H&P , NPO status , Patient's Chart, lab work & pertinent test results, reviewed documented beta blocker date and time   Airway Mallampati: II TM Distance: >3 FB Neck ROM: full    Dental No notable dental hx. (+) Teeth Intact and Dental Advisory Given   Pulmonary neg pulmonary ROS,  clear to auscultation  Pulmonary exam normal       Cardiovascular Exercise Tolerance: Good neg cardio ROS regular Normal    Neuro/Psych Negative Neurological ROS  Negative Psych ROS   GI/Hepatic negative GI ROS, Neg liver ROS,   Endo/Other  Negative Endocrine ROS  Renal/GU negative Renal ROS  Genitourinary negative   Musculoskeletal   Abdominal   Peds  Hematology negative hematology ROS (+)   Anesthesia Other Findings   Reproductive/Obstetrics negative OB ROS                          Anesthesia Physical Anesthesia Plan  ASA: I  Anesthesia Plan: General   Post-op Pain Management:    Induction: Intravenous  Airway Management Planned: Oral ETT  Additional Equipment:   Intra-op Plan:   Post-operative Plan: Extubation in OR  Informed Consent: I have reviewed the patients History and Physical, chart, labs and discussed the procedure including the risks, benefits and alternatives for the proposed anesthesia with the patient or authorized representative who has indicated his/her understanding and acceptance.   Dental Advisory Given  Plan Discussed with: CRNA and Surgeon  Anesthesia Plan Comments:         Anesthesia Quick Evaluation

## 2010-12-29 ENCOUNTER — Encounter (HOSPITAL_COMMUNITY): Payer: Self-pay | Admitting: General Surgery

## 2010-12-29 MED ORDER — PROMETHAZINE HCL 12.5 MG PO TABS: 25.0000 mg | ORAL_TABLET | Freq: Four times a day (QID) | ORAL | Status: AC | PRN

## 2010-12-29 MED ORDER — OXYCODONE-ACETAMINOPHEN 5-325 MG PO TABS: 1.0000 | ORAL_TABLET | ORAL | Status: AC | PRN

## 2010-12-29 NOTE — Discharge Summary (Signed)
Patient ID: Lori [name] MRN: [id number] DOB/AGE: July 05, 1985 25 y.o.  Admit date: 12/27/2010 Discharge date: 12/29/2010  Procedures: Laparoscopic Cholecystectomy  Consults: none  Reason for Admission:  25 yo female who was diagnosed with biliary dyskinesia and had an appointment with our office for 12-28-10.  She had a colonoscopy and endoscopy the day before she presented to the ED.  The next day she woke up with some abdominal pain and came to the ED.  She was admitted.  Admission Diagnoses: 1. Biliary dyskinesia  Hospital Course: The patient was admitted.  The following day she was taken to the operating room where she had a lap chole.  This went well.  On POD #1, the patient felt much better.  She was tolerating a regular diet without any further nausea or vomiting.  She was otherwise felt stable for d/c home.  PE: Abd: soft, minimally tender, +BS, ND, incisions c/d/i with dermabond  Discharge Diagnoses:  Principal Problem:  *Biliary dyskinesia Active Problems:  Abdominal pain  S/P colonoscopy  S/P endoscopy   Discharge Medications: Current Discharge Medication List    START taking these medications   Details  oxyCODONE-acetaminophen (PERCOCET) 5-325 MG per tablet Take 1-2 tablets by mouth every 4 (four) hours as needed for pain. Qty: 30 tablet, Refills: 0    promethazine (PHENERGAN) 12.5 MG tablet Take 2 tablets (25 mg total) by mouth every 6 (six) hours as needed for nausea. Qty: 20 tablet, Refills: 0      CONTINUE these medications which have NOT CHANGED   Details  citalopram (CELEXA) 20 MG tablet Take 20 mg by mouth every morning.     dexlansoprazole (DEXILANT) 60 MG capsule Take 60 mg by mouth daily.     ibuprofen (ADVIL,MOTRIN) 200 MG tablet Take 200 mg by mouth every 6 (six) hours as needed.      loratadine (CLARITIN) 10 MG tablet Take 10 mg by mouth daily.     Na Sulfate-K Sulfate-Mg Sulf (SUPREP BOWEL PREP PO) Take by mouth. Prior to procedure, Drank  2 bottles. 1 bottle on Sunday,1 bottle on monday     norgestimate-ethinyl estradiol (ORTHO-CYCLEN,SPRINTEC,PREVIFEM) 0.25-35 MG-MCG tablet Take 1 tablet by mouth daily.         Discharge Instructions: Follow-up Information    Follow up with [facility] on 01/13/2011. (2:50pm, arrive at 2:35pm)    Contact information:   [phone] [address]         Signed: Deolinda Frid E 12/29/2010, 10:21 AM

## 2010-12-29 NOTE — Discharge Summary (Signed)
Agree with above.  Follow up in clinic.

## 2011-01-03 ENCOUNTER — Telehealth (INDEPENDENT_AMBULATORY_CARE_PROVIDER_SITE_OTHER): Payer: Self-pay

## 2011-01-03 NOTE — Telephone Encounter (Signed)
Patient called and said she has not had a bowel movement since last week. She has tried Miralax and stool softner and is drinking water. I told her to try Milk of Magnesia next and to call us back if that doesn't help by this afternoon.

## 2011-01-13 ENCOUNTER — Encounter (INDEPENDENT_AMBULATORY_CARE_PROVIDER_SITE_OTHER): Payer: PRIVATE HEALTH INSURANCE

## 2011-01-25 ENCOUNTER — Encounter (INDEPENDENT_AMBULATORY_CARE_PROVIDER_SITE_OTHER): Payer: Self-pay | Admitting: Radiology

## 2011-01-25 ENCOUNTER — Ambulatory Visit (INDEPENDENT_AMBULATORY_CARE_PROVIDER_SITE_OTHER): Payer: PRIVATE HEALTH INSURANCE | Admitting: Radiology

## 2011-01-25 VITALS — BP 114/72 | Ht 64.0 in | Wt 170.0 lb

## 2011-01-25 DIAGNOSIS — K811 Chronic cholecystitis: Secondary | ICD-10-CM | POA: Insufficient documentation

## 2011-01-25 NOTE — Progress Notes (Signed)
JOY REIGER [facility] [birth year] [id number] 01/25/2011   Ledell Peoples Cockram is a 26 y.o. female who had a laparoscopic cholecystectomy with intraoperative cholangiogram.  The pathology report confirmed cholecystitis.  The patient reports that they are feeling well with normal bowel movements and good appetite.  The pre-operative symptoms of abdominal pain, nausea, and vomiting have resolved.    She is still getting some heartburn at night, which she says was there before her gb surgery. She says everything else is better overall. She wants to know if she should resume reflux meds. She had previously been on Dexilant but is concerned because it is expensive.  Physical examination - Incisions appear well-healed with no sign of infection or bleeding.   Abdomen - soft, non-tender  Impression:  s/p laparoscopic cholecystectomy  Plan:  She may resume a regular diet and full activity. I advised her to retry the OTC Prilosec first for her reflux symptoms.  She may follow-up on a PRN basis.

## 2011-05-05 ENCOUNTER — Other Ambulatory Visit: Payer: Self-pay | Admitting: Obstetrics and Gynecology

## 2014-10-27 ENCOUNTER — Ambulatory Visit (INDEPENDENT_AMBULATORY_CARE_PROVIDER_SITE_OTHER): Payer: BLUE CROSS/BLUE SHIELD | Admitting: Physician Assistant

## 2014-10-27 VITALS — BP 122/80 | HR 80 | Temp 98.8°F | Resp 16 | Ht 64.0 in | Wt 187.0 lb

## 2014-10-27 DIAGNOSIS — Z23 Encounter for immunization: Secondary | ICD-10-CM | POA: Diagnosis not present

## 2014-10-27 NOTE — Progress Notes (Signed)
[facility] [address], [place] [address] [phone]  Date:  10/27/2014   Name:  Lori [name]   DOB:  [birth year]   MRN:  [id number]  PCP:  Gretel Acre, MD    History of Present Illness:  Lori [name] is a 29 y.o. female patient who presents to [facility] for need for tdap.  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  This was repaired with dermabond, but she needs a tdap.  She is also trying to conceive at this time. No ur sxs.  No adverse side effects of having a tdap in the past.        Patient Active Problem List   Diagnosis Date Noted  . Chronic cholecystitis 01/25/2011  . Biliary dyskinesia 12/27/2010  . Abdominal pain 12/27/2010  . S/P colonoscopy 12/27/2010  . S/P endoscopy 12/27/2010    Past Medical History  Diagnosis Date  . Kidney stones   . Allergy   . Migraines     Past Surgical History  Procedure Laterality Date  . Wisdom tooth extraction    . Colonoscopy  12/26/2010  . Upper gastrointestinal endoscopy  12/26/2010  . Cholecystectomy  12/28/2010    Procedure: LAPAROSCOPIC CHOLECYSTECTOMY;  Surgeon: Almond Lint, MD;  Location: [facility];  Service: General;  Laterality: N/A;  . Minor skin excision for a mole       Social History  Substance Use Topics  . Smoking status: Former Smoker    Types: Cigarettes  . Smokeless tobacco: Former Neurosurgeon    Quit date: 12/26/2008  . Alcohol Use: No    Family History  Problem Relation Age of Onset  . Hypertension Maternal Grandmother   . Diabetes Maternal Grandfather   . Hyperlipidemia Maternal Grandfather   . Heart disease Maternal Grandfather   . Hypertension Maternal Grandfather   . Stroke Maternal Grandfather     Allergies  Allergen Reactions  . Cashew Nut Oil Anaphylaxis  . Amoxicillin-Pot Clavulanate Diarrhea  . Morphine And Related Other (See Comments)    Shakes,cold chills  . Penicillins Rash and Other (See Comments)    fever    Medication  list has been reviewed and updated.  Current Outpatient Prescriptions on File Prior to Visit  Medication Sig Dispense Refill  . loratadine (CLARITIN) 10 MG tablet Take 10 mg by mouth daily.      No current facility-administered medications on file prior to visit.    ROS ROS otherwise unremarkable unless listed above.   Physical Examination: BP 122/80 mmHg  Pulse 80  Temp(Src) 98.8 F (37.1 C) (Oral)  Resp 16  Ht  (1.626 m)  Wt 187 lb (84.823 kg)  BMI 32.08 kg/m2  SpO2 99%  LMP 09/27/2014 Ideal Body Weight: Weight in (lb) to have BMI = 25: 145.3  Physical Exam  Constitutional: She is oriented to person, place, and time. She appears well-developed and well-nourished. No distress.  HENT:  Head: Normocephalic and atraumatic.  Right Ear: External ear normal.  Left Ear: External ear normal.  Eyes: Conjunctivae and EOM are normal. Pupils are equal, round, and reactive to light.  Cardiovascular: Normal rate.   Pulmonary/Chest: Effort normal. No respiratory distress.  Neurological: She is alert and oriented to person, place, and time.  Skin: She is not diaphoretic.  Psychiatric: She has a normal mood and affect. Her behavior is normal.     Assessment and Plan: 29 year old female is here today  for tdap.  This is warranted and recommended.    1. Need for Tdap vaccination - Tdap vaccine greater than or equal to 7yo IM   Trena Platt, PA-C [facility] 10/27/2014 3:23 PM

## 2014-10-27 NOTE — Patient Instructions (Signed)

## 2015-09-08 LAB — OB RESULTS CONSOLE HEPATITIS B SURFACE ANTIGEN
HEP B S AG: NEGATIVE
Hepatitis B Surface Ag: NEGATIVE

## 2015-09-08 LAB — OB RESULTS CONSOLE GC/CHLAMYDIA
CHLAMYDIA, DNA PROBE: NEGATIVE
GC PROBE AMP, GENITAL: NEGATIVE

## 2015-09-08 LAB — OB RESULTS CONSOLE ABO/RH: RH Type: POSITIVE

## 2015-09-08 LAB — OB RESULTS CONSOLE HIV ANTIBODY (ROUTINE TESTING)
HIV: NONREACTIVE
HIV: NONREACTIVE

## 2015-09-08 LAB — OB RESULTS CONSOLE RUBELLA ANTIBODY, IGM
RUBELLA: IMMUNE
Rubella: IMMUNE

## 2015-09-08 LAB — OB RESULTS CONSOLE RPR
RPR: NONREACTIVE
RPR: NONREACTIVE

## 2015-09-08 LAB — OB RESULTS CONSOLE ANTIBODY SCREEN: Antibody Screen: NEGATIVE

## 2015-09-09 ENCOUNTER — Other Ambulatory Visit (HOSPITAL_COMMUNITY): Payer: Self-pay | Admitting: Obstetrics and Gynecology

## 2015-09-09 ENCOUNTER — Encounter (HOSPITAL_COMMUNITY): Payer: Self-pay | Admitting: Obstetrics and Gynecology

## 2015-09-09 DIAGNOSIS — Z3A11 11 weeks gestation of pregnancy: Secondary | ICD-10-CM

## 2015-09-09 DIAGNOSIS — Q894 Conjoined twins: Secondary | ICD-10-CM

## 2015-09-09 DIAGNOSIS — IMO0002 Reserved for concepts with insufficient information to code with codable children: Secondary | ICD-10-CM

## 2015-09-09 DIAGNOSIS — O351XX0 Maternal care for (suspected) chromosomal abnormality in fetus, not applicable or unspecified: Secondary | ICD-10-CM

## 2015-09-16 ENCOUNTER — Encounter (HOSPITAL_COMMUNITY): Payer: Self-pay | Admitting: *Deleted

## 2015-09-17 ENCOUNTER — Encounter (HOSPITAL_COMMUNITY): Payer: Self-pay

## 2015-09-17 ENCOUNTER — Ambulatory Visit (HOSPITAL_COMMUNITY)
Admission: RE | Admit: 2015-09-17 | Discharge: 2015-09-17 | Disposition: A | Discharge status: Home / Self Care | Payer: Medicaid Other | Source: Ambulatory Visit | Attending: Obstetrics and Gynecology | Admitting: Obstetrics and Gynecology

## 2015-09-17 ENCOUNTER — Ambulatory Visit (HOSPITAL_COMMUNITY): Admission: RE | Admit: 2015-09-17 | Payer: Medicaid Other | Source: Ambulatory Visit

## 2015-09-17 DIAGNOSIS — Z3A12 12 weeks gestation of pregnancy: Secondary | ICD-10-CM | POA: Insufficient documentation

## 2015-09-17 DIAGNOSIS — O283 Abnormal ultrasonic finding on antenatal screening of mother: Secondary | ICD-10-CM | POA: Diagnosis not present

## 2015-09-17 DIAGNOSIS — O351XX0 Maternal care for (suspected) chromosomal abnormality in fetus, not applicable or unspecified: Secondary | ICD-10-CM

## 2015-09-17 DIAGNOSIS — Z315 Encounter for genetic counseling: Secondary | ICD-10-CM | POA: Diagnosis not present

## 2015-09-17 DIAGNOSIS — O30041 Twin pregnancy, dichorionic/diamniotic, first trimester: Secondary | ICD-10-CM | POA: Diagnosis not present

## 2015-09-17 DIAGNOSIS — IMO0002 Reserved for concepts with insufficient information to code with codable children: Secondary | ICD-10-CM

## 2015-09-17 DIAGNOSIS — Q894 Conjoined twins: Secondary | ICD-10-CM

## 2015-09-17 DIAGNOSIS — Z3A11 11 weeks gestation of pregnancy: Secondary | ICD-10-CM

## 2015-09-20 ENCOUNTER — Encounter (HOSPITAL_COMMUNITY): Payer: Self-pay

## 2015-09-20 ENCOUNTER — Other Ambulatory Visit (HOSPITAL_COMMUNITY): Payer: Self-pay

## 2015-09-20 DIAGNOSIS — O283 Abnormal ultrasonic finding on antenatal screening of mother: Secondary | ICD-10-CM | POA: Insufficient documentation

## 2015-09-20 NOTE — Progress Notes (Signed)
Genetic Counseling  High-Risk Gestation Note  Appointment Date:  09/17/2015 Referred By: Waynard Reeds, MD Date of Birth:  [birth year] Partner:  Lori [name]   Pregnancy History: G1P0 Estimated Date of Delivery: 03/27/16 Estimated Gestational Age: [redacted]w[redacted]d Attending: Alpha Gula, MD  Lori [name], Lori [name], were seen for genetic counseling because of abnormal ultrasound finding for Twin A in a dichorionic/diamniotic twin gestation.    In summary:  Reviewed ultrasound findings and the association with fetal aneuploidy  Twin A identified to have NT measurement 2.49 mm, which was 97%tile for that gestational age  Discussed significance of prior screening for fetal aneuploidy  First trimester screen was within normal limits for Down syndrome; reviewed benefits and limitations of first screen in twin gestation  Offered additional screening  NIPS-elected to pursue today (MaterniT21)  Ultrasound performed today; see separate results  Fetal echocardiogram: NT measurement associated with approximate 1% risk for congenital heart disease  Discussed option of diagnostic testing  CVS-declined  Amniocentesis-declined  Reviewed ACOG carrier screening options-elected to pursue screening for CF, SMA, and hemoglobinopathies today  Reviewed family history concerns  Lori [name] previously had first trimester screening performed through her OB office. Nuchal translucency measurement was reported to be increased for Twin A: 2.47 mm for CRL of 47 mm, which is 97%tile for gestational age.  The ultrasound report will be sent under separate cover.  We discussed that the fetal NT refers to a fluid filled space between the skin and soft tissues behind the cervical spine.  This space is traditionally measurable between 11 and 13.[redacted] weeks gestation and is considered enlarged at 95%tile for gestational age or greater.  This couple was counseled regarding the  various common etiologies for an enlarged NT including: aneuploidy, single gene conditions, cardiac or great vessel abnormalities, lymphatic system failure, decreased fetal movement, and fetal anemia.    We reviewed chromosomes, nondisjunction, and the common features and prognoses of Down syndrome and other aneuploidies.  Considering Lori [name]. [name]' age, an NT 2.5 mm and a CRL of 47 mm, the risk for fetal aneuploidy is estimated to be 3%. First trimester screening reduced the risk for fetal down syndrome. We reviewed the benefits and limitations of this screening and the decreased sensitivity and specificity in a twin gestation.   In addition, we discussed that an NT of 2.5 is associated with an approximate 1% risk for a fetal cardiac anomaly.  We also discussed single gene conditions and that these conditions are not routinely tested for prenatally unless ultrasound findings or family history significantly increase the suspicion of a specific single gene disorder.  We briefly reviewed common inheritance patterns (dominant, recessive, and X-linked) as well as the associated risks of recurrence.   They were counseled that the fetal prognosis depends on the underlying etiology of the enlarged NT and further anticipatory guidance can be provided if a diagnosis is discovered.    We reviewed other available screening and diagnostic options including noninvasive prenatal screening (NIPS)/cell free DNA (cfDNA) screening, detailed ultrasound and fetal echocardiogram.  She was counseled regarding the benefits and limitations of each option. We discussed the reduced sensitivity in a twin gestation and that a separate result cannot be provided for each twin. We discussed the diagnostic option for chromosome conditions via chorionic villus sampling (CVS) and amniocentesis.  We reviewed the approximate 1 in 100 risk for complications for CVS and the approximate 1 in 300-500 risk for complications for amniocentesis, including  spontaneous pregnancy loss. We discussed the possible results that the tests might provide including: positive, negative, unanticipated (for the pregnancy or the patient), and no result. Finally, they were counseled regarding the cost of each option and potential out of pocket expenses.   After consideration of all the options, she elected to proceed with NIPS (MaterniT21).  Those results will be available in 8-10 days.  They declined CVS and amniocentesis. Ultrasound was performed today; complete report under separate cover. Detailed ultrasound and fetal echocardiogram are available to the patient in the second trimester.   Lori [name] was provided with written information regarding cystic fibrosis (CF), spinal muscular atrophy (SMA) and hemoglobinopathies including the carrier frequency, availability of carrier screening and prenatal diagnosis if indicated.  In addition, we discussed that CF and hemoglobinopathies are routinely screened for as part of the [facility] newborn screening panel.  After further discussion, she elected to pursue screening for CF, SMA and hemoglobinopathies.  Both family histories were reviewed and found to be contributory for a maternal first cousin once removed to the patient with intellectual disability (her maternal grandfather's sister's son). He is currently approximately 29 years old. He does not live independently and does not work outside the home. He was not described to have dysmorphic features, and no etiology is known. No additional relatives were reported with similar features. Lori [name] was counseled that there are many different causes of intellectual disabilities including environmental, multifactorial, and genetic etiologies.  We discussed that a specific diagnosis for intellectual disability can be determined in approximately 50% of these individuals.  In the remaining 50% of individuals, a diagnosis may never be determined.  Regarding genetic  causes, we discussed that chromosome aberrations (aneuploidy, deletions, duplications, insertions, and translocations) are responsible for a small percentage of individuals with intellectual disability.  Many individuals with chromosome aberrations have additional differences, including congenital anomalies or minor dysmorphisms.  Likewise, single gene conditions are the underlying cause of intellectual delay in some families.  We discussed that many gene conditions have intellectual disability as a feature, but also often include other physical or medical differences.  We discussed that without more specific information, it is difficult to provide an accurate risk assessment.  Further genetic counseling is warranted if more information is obtained.  Lori [name] denied exposure to environmental toxins or chemical agents. She denied the use of alcohol, tobacco or street drugs. She denied significant viral illnesses during the course of her pregnancy. Her medical and surgical histories were noncontributory.   I counseled this couple regarding the above risks and available options.  The approximate face-to-face time with the genetic counselor was 40 minutes.   Lori PlowmanKaren Lamario Mani, Lori [name] Certified Genetic Counselor 09/20/2015

## 2015-09-23 ENCOUNTER — Other Ambulatory Visit (HOSPITAL_COMMUNITY): Payer: Self-pay

## 2015-09-23 ENCOUNTER — Telehealth (HOSPITAL_COMMUNITY): Payer: Self-pay | Admitting: MS"

## 2015-09-23 NOTE — Telephone Encounter (Signed)
Called Lori [name] to discuss her prenatal cell free DNA test results.  Mrs. Lori [name] had MaterniT21 testing through [facility].  Testing was offered because of increased nuchal translucency in a twin gestation.   The patient was identified by name and DOB.  We reviewed that these are within normal limits for chromosomes 21, 18, and 13. Additionally, chromosome material was analyzed from chromosomes 16, 22, and sex chromosomes and was determined to be within normal range. Select microdeletions were also screened (22q, 15q, 11q, 8q, 5p, 4p, and 1p) and within normal range.  We reviewed that this testing identifies approximately 90% of pregnancies with trisomy 1421. She is aware that detection rate is decreased in a twin gestation. The patient did not want information disclosed regarding fetal sex. She understands that this testing does not identify all genetic conditions.  All questions were answered to her satisfaction, she was encouraged to call with additional questions or concerns.  Quinn PlowmanKaren Jessie Cowher, MS Certified Genetic Counselor 09/23/2015 11:01 AM

## 2015-09-27 LAB — OB RESULTS CONSOLE GC/CHLAMYDIA
Chlamydia: NEGATIVE
Gonorrhea: NEGATIVE

## 2015-09-28 ENCOUNTER — Other Ambulatory Visit (HOSPITAL_COMMUNITY): Payer: Self-pay

## 2015-10-07 ENCOUNTER — Other Ambulatory Visit: Payer: Self-pay | Admitting: Obstetrics and Gynecology

## 2016-02-06 ENCOUNTER — Encounter (HOSPITAL_COMMUNITY): Payer: Self-pay

## 2016-02-06 ENCOUNTER — Observation Stay (HOSPITAL_COMMUNITY)
Admission: AD | Admit: 2016-02-06 | Discharge: 2016-02-07 | Disposition: A | Discharge status: Home / Self Care | Payer: 59 | Source: Ambulatory Visit | Attending: Obstetrics & Gynecology | Admitting: Obstetrics & Gynecology

## 2016-02-06 ENCOUNTER — Observation Stay (HOSPITAL_COMMUNITY): Payer: 59

## 2016-02-06 DIAGNOSIS — O30003 Twin pregnancy, unspecified number of placenta and unspecified number of amniotic sacs, third trimester: Secondary | ICD-10-CM

## 2016-02-06 DIAGNOSIS — O30043 Twin pregnancy, dichorionic/diamniotic, third trimester: Secondary | ICD-10-CM | POA: Diagnosis not present

## 2016-02-06 DIAGNOSIS — Z3A33 33 weeks gestation of pregnancy: Secondary | ICD-10-CM

## 2016-02-06 DIAGNOSIS — Z87891 Personal history of nicotine dependence: Secondary | ICD-10-CM | POA: Diagnosis not present

## 2016-02-06 DIAGNOSIS — O9989 Other specified diseases and conditions complicating pregnancy, childbirth and the puerperium: Secondary | ICD-10-CM | POA: Diagnosis present

## 2016-02-06 DIAGNOSIS — Z3A32 32 weeks gestation of pregnancy: Secondary | ICD-10-CM

## 2016-02-06 DIAGNOSIS — O30049 Twin pregnancy, dichorionic/diamniotic, unspecified trimester: Secondary | ICD-10-CM | POA: Diagnosis present

## 2016-02-06 DIAGNOSIS — O4703 False labor before 37 completed weeks of gestation, third trimester: Secondary | ICD-10-CM

## 2016-02-06 DIAGNOSIS — O283 Abnormal ultrasonic finding on antenatal screening of mother: Secondary | ICD-10-CM

## 2016-02-06 LAB — TYPE AND SCREEN
ABO/RH(D): B POS
ANTIBODY SCREEN: NEGATIVE

## 2016-02-06 LAB — URINALYSIS, ROUTINE W REFLEX MICROSCOPIC
BILIRUBIN URINE: NEGATIVE
Hgb urine dipstick: NEGATIVE
KETONES UR: 5 mg/dL — AB
Nitrite: NEGATIVE
PH: 5 (ref 5.0–8.0)
Protein, ur: 30 mg/dL — AB
Specific Gravity, Urine: 1.022 (ref 1.005–1.030)

## 2016-02-06 LAB — ABO/RH: ABO/RH(D): B POS

## 2016-02-06 MED ORDER — SODIUM CHLORIDE 0.9% FLUSH: 3.0000 mL | INTRAVENOUS | Status: DC | PRN

## 2016-02-06 MED ORDER — NIFEDIPINE 10 MG PO CAPS: 10.0000 mg | ORAL_CAPSULE | Freq: Four times a day (QID) | ORAL | Status: DC

## 2016-02-06 MED ORDER — DOCUSATE SODIUM 100 MG PO CAPS: 100.0000 mg | ORAL_CAPSULE | Freq: Every day | ORAL | Status: DC

## 2016-02-06 MED ORDER — BETAMETHASONE SOD PHOS & ACET 6 (3-3) MG/ML IJ SUSP
12.0000 mg | INTRAMUSCULAR | Status: AC
  Administered 2016-02-06 – 2016-02-07 (×2): 12 mg via INTRAMUSCULAR
  Filled 2016-02-06 (×2): qty 2

## 2016-02-06 MED ORDER — PRENATAL MULTIVITAMIN CH: 1.0000 | ORAL_TABLET | Freq: Every day | ORAL | Status: DC

## 2016-02-06 MED ORDER — ZOLPIDEM TARTRATE 5 MG PO TABS: 5.0000 mg | ORAL_TABLET | Freq: Every evening | ORAL | Status: DC | PRN

## 2016-02-06 MED ORDER — ACETAMINOPHEN 325 MG PO TABS: 650.0000 mg | ORAL_TABLET | ORAL | Status: DC | PRN

## 2016-02-06 MED ORDER — NIFEDIPINE 10 MG PO CAPS: 20.0000 mg | ORAL_CAPSULE | Freq: Once | ORAL | Status: DC

## 2016-02-06 MED ORDER — SODIUM CHLORIDE 0.9 % IV SOLN: 250.0000 mL | INTRAVENOUS | Status: DC | PRN

## 2016-02-06 MED ORDER — SODIUM CHLORIDE 0.9% FLUSH
3.0000 mL | Freq: Two times a day (BID) | INTRAVENOUS | Status: DC
  Administered 2016-02-06: 3 mL via INTRAVENOUS

## 2016-02-06 MED ORDER — CALCIUM CARBONATE ANTACID 500 MG PO CHEW
2.0000 | CHEWABLE_TABLET | ORAL | Status: DC | PRN
  Administered 2016-02-06: 400 mg via ORAL
  Filled 2016-02-06: qty 2

## 2016-02-06 NOTE — H&P (Signed)
[name] is Mar 13, 1985  Patient's last menstrual period was 06/21/2015. 33 weeks with Di/Di Twins with lower abdominal pain and back pain radiating down her right side. She also notes some numbness in her arms and hands.  Patient feels more pelvic pressures.  She denies leaking of fluid, no vaginal bleeding, and reports good fetal movement.   Pregnancy complicated by Lori [name] / Di twins  PMHX  Past Medical History:  Diagnosis Date  . Allergy   . Kidney stones   . Migraines     PSURG HX  Past Surgical History:  Procedure Laterality Date  . CHOLECYSTECTOMY  12/28/2010   Procedure: LAPAROSCOPIC CHOLECYSTECTOMY;  Surgeon: Lori LintFaera Byerly, MD;  Location: [facility];  Service: General;  Laterality: N/A;  . COLONOSCOPY  12/26/2010  . minor skin excision for a mole     . UPPER GASTROINTESTINAL ENDOSCOPY  12/26/2010  . WISDOM TOOTH EXTRACTION      Fam HX  Family History  Problem Relation Age of Onset  . Hypertension Maternal Grandmother   . Diabetes Maternal Grandfather   . Hyperlipidemia Maternal Grandfather   . Heart disease Maternal Grandfather   . Hypertension Maternal Grandfather   . Stroke Maternal Grandfather     Soc HX  Social History   Social History  . Marital status: Married    Spouse name: N/A  . Number of children: N/A  . Years of education: N/A   Occupational History  . Not on file.   Social History Main Topics  . Smoking status: Former Smoker    Types: Cigarettes  . Smokeless tobacco: Former NeurosurgeonUser    Quit date: 12/26/2008  . Alcohol use No  . Drug use: No  . Sexual activity: Yes   Other Topics Concern  . Not on file   Social History Narrative  . No narrative on file    ROS  Review of Systems - Negative except numbness, pelvic and back pain / pressure  Ultrasound: Ordered and results pending  Physical Gen [facility]  IN NAD ABD Gravid soft NT GU 1cm / 70%/ Baby A vertex, -1 EXT No C/C +1 edema Baby A: 145 moderate variability accels no decels  Baby B:  135-140 baseline moderate variability accels no decels Cat I.   TOCO: no ctx  Assessment: [name] / [name] Twin pregnancy most likely sciatic pain, but her cervix is thin and now dilated so concern for PTL.    Plan: Admit to antepartum fo 23 hr obs Start betamethasone for FLM Procardia prn ctx pain.  Will get US for fetal position/ growth, and cervical length  Lori [name] Lori [name]

## 2016-02-06 NOTE — MAU Note (Signed)
Pt presents to MAU with right sided numbness (arm, leg and hip) and pain in right groin area. Symptoms began today. Denies abnormal discharge. Reports pressure and pain with urination. Denies any bleeding or leaking of fluid. Reports good fetal movement.

## 2016-02-07 DIAGNOSIS — O30043 Twin pregnancy, dichorionic/diamniotic, third trimester: Secondary | ICD-10-CM | POA: Diagnosis not present

## 2016-02-07 NOTE — Discharge Summary (Signed)
Obstetric Discharge Summary Reason for Admission: Vaginal pressure and contractions Prenatal Procedures: NST and ultrasound Intrapartum Procedures: NA, Undelivered Postpartum Procedures:  NA, Undelivered Complications-Operative and Postpartum:  NA, Undelivered Hemoglobin  Date Value Ref Range Status  12/27/2010 14.2 12.0 - 15.0 g/dL Final   HCT  Date Value Ref Range Status  12/27/2010 41.1 36.0 - 46.0 % Final    Physical Exam:  General: alert, cooperative and appears stated age  FHR: 140 x 2, reactive tracing, category 1. No contractions noted on monitor US: Cvx length 3.5 cm  Discharge Diagnoses: Di/Di twin gestation, Preterm contractions  Discharge Information: Date: 02/07/2016 Activity: unrestricted Diet: routine Medications: PNV Condition: improved Instructions: refer to practice specific booklet Discharge to: home     Daysha Ashmore H. 02/07/2016, 11:06 AM

## 2016-02-07 NOTE — Discharge Instructions (Signed)
Braxton Hicks Contractions °Contractions of the uterus can occur throughout pregnancy. Contractions are not always a sign that you are in labor.  °WHAT ARE BRAXTON HICKS CONTRACTIONS?  °Contractions that occur before labor are called Braxton Hicks contractions, or false labor. Toward the end of pregnancy (32-34 weeks), these contractions can develop more often and may become more forceful. This is not true labor because these contractions do not result in opening (dilatation) and thinning of the cervix. They are sometimes difficult to tell apart from true labor because these contractions can be forceful and people have different pain tolerances. You should not feel embarrassed if you go to the hospital with false labor. Sometimes, the only way to tell if you are in true labor is for your health care provider to look for changes in the cervix. °If there are no prenatal problems or other health problems associated with the pregnancy, it is completely safe to be sent home with false labor and await the onset of true labor. °HOW CAN YOU TELL THE DIFFERENCE BETWEEN TRUE AND FALSE LABOR? °False Labor  °· The contractions of false labor are usually shorter and not as hard as those of true labor.   °· The contractions are usually irregular.   °· The contractions are often felt in the front of the lower abdomen and in the groin.   °· The contractions may go away when you walk around or change positions while lying down.   °· The contractions get weaker and are shorter lasting as time goes on.   °· The contractions do not usually become progressively stronger, regular, and closer together as with true labor.   °True Labor  °· Contractions in true labor last 30-70 seconds, become very regular, usually become more intense, and increase in frequency.   °· The contractions do not go away with walking.   °· The discomfort is usually felt in the top of the uterus and spreads to the lower abdomen and low back.   °· True labor can be  determined by your health care provider with an exam. This will show that the cervix is dilating and getting thinner.   °WHAT TO REMEMBER °· Keep up with your usual exercises and follow other instructions given by your health care provider.   °· Take medicines as directed by your health care provider.   °· Keep your regular prenatal appointments.   °· Eat and drink lightly if you think you are going into labor.   °· If Braxton Hicks contractions are making you uncomfortable:   °¨ Change your position from lying down or resting to walking, or from walking to resting.   °¨ Sit and rest in a tub of warm water.   °¨ Drink 2-3 glasses of water. Dehydration may cause these contractions.   °¨ Do slow and deep breathing several times an hour.   °WHEN SHOULD I SEEK IMMEDIATE MEDICAL CARE? °Seek immediate medical care if: °· Your contractions become stronger, more regular, and closer together.   °· You have fluid leaking or gushing from your vagina.   °· You have a fever.   °· You pass blood-tinged mucus.   °· You have vaginal bleeding.   °· You have continuous abdominal pain.   °· You have low back pain that you never had before.   °· You feel your baby's head pushing down and causing pelvic pressure.   °· Your baby is not moving as much as it used to.   °This information is not intended to replace advice given to you by your health care provider. Make sure you discuss any questions you have with your health care   provider. °Document Released: 01/09/2005 Document Revised: 05/03/2015 Document Reviewed: 10/21/2012 °Elsevier Interactive Patient Education © 2017 [employer] ° °

## 2016-02-07 NOTE — Progress Notes (Signed)
Pt discharged with written instructions. NO concerns noted. Pt verbalized an understanding of instructions. Carmelina DaneERRI L Stetson Pelaez, RN

## 2016-02-11 ENCOUNTER — Encounter (HOSPITAL_COMMUNITY): Payer: Self-pay | Admitting: *Deleted

## 2016-02-11 ENCOUNTER — Inpatient Hospital Stay (HOSPITAL_COMMUNITY)
Admission: AD | Admit: 2016-02-11 | Discharge: 2016-02-11 | Disposition: A | Discharge status: Home / Self Care | Payer: 59 | Source: Ambulatory Visit | Attending: Obstetrics | Admitting: Obstetrics

## 2016-02-11 DIAGNOSIS — Z880 Allergy status to penicillin: Secondary | ICD-10-CM | POA: Insufficient documentation

## 2016-02-11 DIAGNOSIS — O2243 Hemorrhoids in pregnancy, third trimester: Secondary | ICD-10-CM | POA: Diagnosis not present

## 2016-02-11 DIAGNOSIS — R197 Diarrhea, unspecified: Secondary | ICD-10-CM | POA: Insufficient documentation

## 2016-02-11 DIAGNOSIS — N9489 Other specified conditions associated with female genital organs and menstrual cycle: Secondary | ICD-10-CM

## 2016-02-11 DIAGNOSIS — Z3A33 33 weeks gestation of pregnancy: Secondary | ICD-10-CM | POA: Diagnosis not present

## 2016-02-11 DIAGNOSIS — Z885 Allergy status to narcotic agent status: Secondary | ICD-10-CM | POA: Diagnosis not present

## 2016-02-11 DIAGNOSIS — O26893 Other specified pregnancy related conditions, third trimester: Secondary | ICD-10-CM | POA: Insufficient documentation

## 2016-02-11 DIAGNOSIS — O30043 Twin pregnancy, dichorionic/diamniotic, third trimester: Secondary | ICD-10-CM | POA: Insufficient documentation

## 2016-02-11 DIAGNOSIS — N898 Other specified noninflammatory disorders of vagina: Secondary | ICD-10-CM | POA: Insufficient documentation

## 2016-02-11 DIAGNOSIS — R109 Unspecified abdominal pain: Secondary | ICD-10-CM | POA: Insufficient documentation

## 2016-02-11 DIAGNOSIS — Z87891 Personal history of nicotine dependence: Secondary | ICD-10-CM | POA: Diagnosis not present

## 2016-02-11 DIAGNOSIS — Z87442 Personal history of urinary calculi: Secondary | ICD-10-CM | POA: Diagnosis not present

## 2016-02-11 DIAGNOSIS — O9989 Other specified diseases and conditions complicating pregnancy, childbirth and the puerperium: Secondary | ICD-10-CM | POA: Diagnosis not present

## 2016-02-11 LAB — URINALYSIS, ROUTINE W REFLEX MICROSCOPIC
Bilirubin Urine: NEGATIVE
GLUCOSE, UA: NEGATIVE mg/dL
KETONES UR: 15 mg/dL — AB
NITRITE: NEGATIVE
PH: 6 (ref 5.0–8.0)
Protein, ur: NEGATIVE mg/dL
SPECIFIC GRAVITY, URINE: 1.025 (ref 1.005–1.030)

## 2016-02-11 LAB — URINALYSIS, MICROSCOPIC (REFLEX)

## 2016-02-11 LAB — FETAL FIBRONECTIN: Fetal Fibronectin: NEGATIVE

## 2016-02-11 MED ORDER — HYDROCORTISONE ACE-PRAMOXINE 1-1 % RE FOAM: 1.0000 | Freq: Two times a day (BID) | RECTAL | 0 refills | Status: DC

## 2016-02-11 NOTE — MAU Provider Note (Signed)
Chief Complaint:  Contractions; Diarrhea; and ? leaking   First Provider Initiated Contact with Patient 02/11/16 1318     HPI: Lori [name] is a 31 y.o. G1P0 at 15w4dwho presents to maternity admissions reporting irregular contractions off and on for the past 2 days.  Also reports having diarrhea for 2 days. No vomiting.  States was 1cm last week.   Had one episode of a wet pantiliner, no further leaking. . She reports good fetal movement, denies LOF, vaginal bleeding, vaginal itching/burning, urinary symptoms, h/a, dizziness, n/v, constipation or fever/chills.  Also c/o lots of discomfort with hemorrhoids  Abdominal Pain  This is a new problem. The current episode started yesterday. The onset quality is gradual. The problem occurs intermittently. The problem has been waxing and waning. The pain is located in the suprapubic region. The pain is mild. The quality of the pain is cramping. The abdominal pain does not radiate. Associated symptoms include diarrhea. Pertinent negatives include no constipation, dysuria, fever, frequency, headaches, myalgias, nausea or vomiting. Nothing aggravates the pain. The pain is relieved by nothing. She has tried nothing for the symptoms.  Vaginal Discharge  The patient's primary symptoms include vaginal discharge. The patient's pertinent negatives include no genital itching, genital lesions, genital odor or vaginal bleeding. This is a new problem. The current episode started yesterday. The problem occurs rarely. The problem has been resolved. The pain is mild. She is pregnant. Associated symptoms include abdominal pain and diarrhea. Pertinent negatives include no constipation, dysuria, fever, frequency, headaches, nausea or vomiting. The vaginal discharge was clear. There has been no bleeding.   RN Note: Been having sporadic ctx's for the past 48 hrs, nothing consistent.  Has also been having diarrhea the past 48 hrs. (twice today, more formed)   Was 1 cm last wk.   Also? Leaking  Past Medical History: Past Medical History:  Diagnosis Date  . Allergy   . Kidney stones   . Migraines     Past obstetric history: OB History  Gravida Para Term Preterm AB Living  1            SAB TAB Ectopic Multiple Live Births               # Outcome Date GA Lbr Len/2nd Weight Sex Delivery Anes PTL Lv  1 Current               Past Surgical History: Past Surgical History:  Procedure Laterality Date  . CHOLECYSTECTOMY  12/28/2010   Procedure: LAPAROSCOPIC CHOLECYSTECTOMY;  Surgeon: Almond Lint, MD;  Location: [facility];  Service: General;  Laterality: N/A;  . COLONOSCOPY  12/26/2010  . minor skin excision for a mole     . UPPER GASTROINTESTINAL ENDOSCOPY  12/26/2010  . WISDOM TOOTH EXTRACTION      Family History: Family History  Problem Relation Age of Onset  . Hypertension Maternal Grandmother   . Diabetes Maternal Grandfather   . Hyperlipidemia Maternal Grandfather   . Heart disease Maternal Grandfather   . Hypertension Maternal Grandfather   . Stroke Maternal Grandfather     Social History: Social History  Substance Use Topics  . Smoking status: Former Smoker    Types: Cigarettes  . Smokeless tobacco: Former Neurosurgeon    Quit date: 12/26/2008  . Alcohol use No    Allergies:  Allergies  Allergen Reactions  . Bee Venom Anaphylaxis  . 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    Marland Kitchen  Amoxicillin-Pot Clavulanate Diarrhea and Other (See Comments)    Has patient had a PCN reaction causing immediate rash, facial/tongue/throat swelling, SOB or lightheadedness with hypotension: No Has patient had a PCN reaction causing severe rash involving mucus membranes or skin necrosis: No Has patient had a PCN reaction that required hospitalization No Has patient had a PCN reaction occurring within the last 10 years: No If all of the above answers are "NO", then may proceed with Cephalosporin use.  Marland Kitchen Morphine And Related Other (See  Comments)    Reaction:  Caused pt to uncontrollably shake   . Penicillins Rash and Other (See Comments)    Has patient had a PCN reaction causing immediate rash, facial/tongue/throat swelling, SOB or lightheadedness with hypotension: No Has patient had a PCN reaction causing severe rash involving mucus membranes or skin necrosis: No Has patient had a PCN reaction that required hospitalization No Has patient had a PCN reaction occurring within the last 10 years: No If all of the above answers are "NO", then may proceed with Cephalosporin use.    Meds:  Prescriptions Prior to Admission  Medication Sig Dispense Refill Last Dose  . acetaminophen (TYLENOL) 500 MG tablet Take 1,000 mg by mouth every 6 (six) hours as needed for mild pain, moderate pain or headache.   02/06/2016 at 1000  . EPINEPHrine (EPIPEN 2-PAK) 0.3 mg/0.3 mL IJ SOAJ injection Inject 0.3 mg into the muscle once as needed (for severe allergic reaction).   PRN at PRN  . levocetirizine (XYZAL) 5 MG tablet Take 5 mg by mouth daily.   02/06/2016 at Unknown time  . loratadine (CLARITIN) 10 MG tablet Take 10 mg by mouth daily.    02/06/2016 at Unknown time  . phenylephrine (SUDAFED PE) 10 MG TABS tablet Take 10 mg by mouth every 4 (four) hours as needed (for cough/congestion).   02/05/2016 at 1600  . Prenatal MV-Min-FA-Omega-3 (PRENATAL GUMMIES/DHA & FA) 0.4-32.5 MG CHEW Chew 2 each by mouth daily.   02/06/2016 at Unknown time    I have reviewed patient's Past Medical Hx, Surgical Hx, Family Hx, Social Hx, medications and allergies.   ROS:  Review of Systems  Constitutional: Negative for fever.  Gastrointestinal: Positive for abdominal pain and diarrhea. Negative for constipation, nausea and vomiting.  Genitourinary: Positive for vaginal discharge. Negative for dysuria and frequency.  Musculoskeletal: Negative for myalgias.  Neurological: Negative for headaches.   Other systems negative  Physical Exam  Patient Vitals for the past  24 hrs:  BP Temp Temp src Pulse Resp Weight  02/11/16 1251 133/87 98.1 F (36.7 C) Oral 119 20 222 lb (100.7 kg)   Constitutional: Well-developed, well-nourished female in no acute distress.  Cardiovascular: normal rate and rhythm Respiratory: normal effort, clear to auscultation bilaterally GI: Abd soft, non-tender, gravid appropriate for gestational age.   No rebound or guarding. MS: Extremities nontender, no edema, normal ROM Neurologic: Alert and oriented x 4.  GU: Neg CVAT.  PELVIC EXAM: Cervix pink, visually closed, without lesion, scant white creamy discharge, vaginal walls and external genitalia normal Bimanual exam: Cervix firm, posterior, neg CMT, uterus nontender, Fundal Height consistent with dates, adnexa without tenderness, enlargement, or mass  Dilation: Fingertip Effacement (%): Thick Cervical Position: Posterior Station: Ballotable Exam by:: Wynelle Bourgeois, CNM    FHT:  Baseline 140 , moderate variability, accelerations present, no decelerations Contractions:  Rare   Labs: No results found for this or any previous visit (from the past 24 hour(s)). --/--/B POS (01/14 1600)  Imaging:  [place] Mfm Ob Transvaginal  Result Date: 02/10/2016 ----------------------------------------------------------------------  OBSTETRICS REPORT                        (Corrected Final 02/10/2016 02:08                                                                          pm) ---------------------------------------------------------------------- Patient Info  ID #:       [id number]                          D.O.B.:  [birth year] (30 yrs)  Name:       Lori [name]               Visit Date: 02/06/2016 02:42 pm ---------------------------------------------------------------------- Performed By  Performed By:     Raoul PitchLora Mae Murrow        Ref. Address:     [address]                    [place]                                                             [id number]  Attending:        Charlsie MerlesMark Newman MD          Secondary Phy.:   MAU Nursing-                                                             MAU/Triage  Referred By:      Essie HartWALDA PINN MD          [facility] ---------------------------------------------------------------------- Orders   #  Description                                 Code   1  US MFM OB LIMITED                           76815.01   2  US MFM OB TRANSVAGINAL                      [id number]  ----------------------------------------------------------------------   #  Ordered By               Order #        Accession #    Episode #   1  Essie HartWALDA PINN               [id number]      [phone]     [id number]   2  WALDA PINN  [id number]      [id number]     [id number]  ---------------------------------------------------------------------- Indications   [redacted] weeks gestation of pregnancy                Z3A.32   Threatened preterm labor, antepartum           O12.00   Twin pregnancy, di/di, third trimester         O30.043  ---------------------------------------------------------------------- OB History  Gravidity:    1 ---------------------------------------------------------------------- Fetal Evaluation (Fetus A)  Num Of Fetuses:     2  Fetal Heart         155  Rate(bpm):  Cardiac Activity:   Observed  Fetal Lie:          Maternal right side  Presentation:       Cephalic  Placenta:           Posterior, above cervical os  Membrane Desc:      Dividing Membrane seen  Amniotic Fluid  AFI FV:      Subjectively within normal limits                              Largest Pocket(cm)                              3.17 ---------------------------------------------------------------------- Gestational Age (Fetus A)  LMP:           32w 6d        Date:  06/21/15                 EDD:   03/27/16  Best:          Armida Sans 6d     Det. By:  LMP  (06/21/15)          EDD:   03/27/16 ---------------------------------------------------------------------- Fetal Evaluation (Fetus B)  Num Of Fetuses:     2  Fetal Heart          136  Rate(bpm):  Cardiac Activity:   Observed  Fetal Lie:          Maternal left side  Presentation:       Breech  Placenta:           Posterior, above cervical os  Membrane Desc:      Dividing Membrane seen  Amniotic Fluid  AFI FV:      Subjectively within normal limits                              Largest Pocket(cm)                              2.41 ---------------------------------------------------------------------- Gestational Age (Fetus B)  LMP:           32w 6d        Date:  06/21/15                 EDD:   03/27/16  Best:          Armida Sans 6d     Det. By:  LMP  (06/21/15)          EDD:   03/27/16 ---------------------------------------------------------------------- Cervix Uterus Adnexa  Cervix  Length:           3.53  cm.  Normal appearance by transvaginal  scan  Cul De Sac:   No free fluid seen. ---------------------------------------------------------------------- Impression  Diamniotic Dichorionic intrauterine pregnancy at weeks  Presentation is cephalic/breech  Normal amniotic fluid in each sac, with  MVP of 3.2 cm for A  and 2.4 cm for B  There is normal fetal movement and cardiac activity on both  twins  A transvaginal evaluation of the cervix showed lengths from  29-35 mm with no funnelling ---------------------------------------------------------------------- Recommendations  No evidence of fetal compromise. the use of transvaginal  ceervical length to predict the likelihood of preterm delivery  has not been validated in multifetal gestation. Similarly, the  use of transvaginal cervical length after 28 weeks is of little or  no utility for the prediction of preterm delivery risk ----------------------------------------------------------------------               Fayne Norrie, BS, RDMS, RVT Electronically Signed Corrected Final Report  02/10/2016 02:08 pm ----------------------------------------------------------------------  [place] Mfm Ob Limited  Result Date:  02/10/2016 ----------------------------------------------------------------------  OBSTETRICS REPORT                        (Corrected Final 02/10/2016 02:08                                                                          pm) ---------------------------------------------------------------------- Patient Info  ID #:       [id number]                          D.O.B.:  Jun 05, 1985 (30 yrs)  Name:       Lori [name]               Visit Date: 02/06/2016 02:42 pm ---------------------------------------------------------------------- Performed By  Performed By:     Raoul Pitch        Ref. Address:     [address]                    [place]                                                             [id number]  Attending:        Charlsie Merles MD         Secondary Phy.:   MAU Nursing-                                                             MAU/Triage  Referred By:      Essie Hart MD          Location:         [facility] ---------------------------------------------------------------------- Orders   #  Description  Code   1  [place] MFM OB LIMITED                           [id number]   2  [place] MFM OB TRANSVAGINAL                      [id number]  ----------------------------------------------------------------------   #  Ordered By               Order #        Accession #    Episode #   1  Essie Hart               [id number]      [id number]     [id number]   2  WALDA PINN               [id number]      [id number]     [id number]  ---------------------------------------------------------------------- Indications   [redacted] weeks gestation of pregnancy                Z3A.32   Threatened preterm labor, antepartum           O43.00   Twin pregnancy, di/di, third trimester         O30.043  ---------------------------------------------------------------------- OB History  Gravidity:    1 ---------------------------------------------------------------------- Fetal Evaluation (Fetus A)  Num Of Fetuses:     2   Fetal Heart         155  Rate(bpm):  Cardiac Activity:   Observed  Fetal Lie:          Maternal right side  Presentation:       Cephalic  Placenta:           Posterior, above cervical os  Membrane Desc:      Dividing Membrane seen  Amniotic Fluid  AFI FV:      Subjectively within normal limits                              Largest Pocket(cm)                              3.17 ---------------------------------------------------------------------- Gestational Age (Fetus A)  LMP:           32w 6d        Date:  06/21/15                 EDD:   03/27/16  Best:          Armida Sans 6d     Det. By:  LMP  (06/21/15)          EDD:   03/27/16 ---------------------------------------------------------------------- Fetal Evaluation (Fetus B)  Num Of Fetuses:     2  Fetal Heart         136  Rate(bpm):  Cardiac Activity:   Observed  Fetal Lie:          Maternal left side  Presentation:       Breech  Placenta:           Posterior, above cervical os  Membrane Desc:      Dividing Membrane seen  Amniotic Fluid  AFI FV:      Subjectively within normal limits  Largest Pocket(cm)                              2.41 ---------------------------------------------------------------------- Gestational Age (Fetus B)  LMP:           32w 6d        Date:  06/21/15                 EDD:   03/27/16  Best:          Armida Sans 6d     Det. By:  LMP  (06/21/15)          EDD:   03/27/16 ---------------------------------------------------------------------- Cervix Uterus Adnexa  Cervix  Length:           3.53  cm.  Normal appearance by transvaginal scan  Cul De Sac:   No free fluid seen. ---------------------------------------------------------------------- Impression  Diamniotic Dichorionic intrauterine pregnancy at weeks  Presentation is cephalic/breech  Normal amniotic fluid in each sac, with  MVP of 3.2 cm for A  and 2.4 cm for B  There is normal fetal movement and cardiac activity on both  twins  A transvaginal evaluation of the cervix showed  lengths from  29-35 mm with no funnelling ---------------------------------------------------------------------- Recommendations  No evidence of fetal compromise. the use of transvaginal  ceervical length to predict the likelihood of preterm delivery  has not been validated in multifetal gestation. Similarly, the  use of transvaginal cervical length after 28 weeks is of little or  no utility for the prediction of preterm delivery risk ----------------------------------------------------------------------               Fayne Norrie, BS, RDMS, RVT Electronically Signed Corrected Final Report  02/10/2016 02:08 pm ----------------------------------------------------------------------   MAU Course/MDM: I have ordered labs and reviewed results.   FFn was negative NST reviewed  Consult Dr Chestine Spore with presentation, exam findings and test results.  No evidence of labor Neg FFn No evidence of leaking fluids  Assessment: [facility] at [redacted]w[redacted]d Vaginal discharge Rare contractions with no cervical change and Neg Fetal fibronectin Hemorrhoids  Plan: Discharge home Preterm Labor precautions and fetal kick counts Rx Proctofoam HC Follow up in Office for prenatal visits and recheck of cervix Encouraged to return here or to other Urgent Care/ED if she develops worsening of symptoms, increase in pain, fever, or other concerning symptoms.    Pt stable at time of discharge.  Wynelle Bourgeois CNM, MSN Certified Nurse-Midwife 02/11/2016 1:18 PM

## 2016-02-11 NOTE — Discharge Instructions (Signed)
Preterm Labor and Birth Information °Pregnancy normally lasts 39-41 weeks. Preterm labor is when labor starts early. It starts before you have been pregnant for 37 whole weeks. °What are the risk factors for preterm labor? °Preterm labor is more likely to occur in women who: °· Have an infection while pregnant. °· Have a cervix that is short. °· Have gone into preterm labor before. °· Have had surgery on their cervix. °· Are younger than age 30. °· Are older than age 35. °· Are African American. °· Are pregnant with two or more babies. °· Take street drugs while pregnant. °· Smoke while pregnant. °· Do not gain enough weight while pregnant. °· Got pregnant right after another pregnancy. °What are the symptoms of preterm labor? °Symptoms of preterm labor include: °· Cramps. The cramps may feel like the cramps some women get during their period. The cramps may happen with watery poop (diarrhea). °· Pain in the belly (abdomen). °· Pain in the lower back. °· Regular contractions or tightening. It may feel like your belly is getting tighter. °· Pressure in the lower belly that seems to get stronger. °· More fluid (discharge) leaking from the vagina. The fluid may be watery or bloody. °· Water breaking. °Why is it important to notice signs of preterm labor? °Babies who are born early may not be fully developed. They have a higher chance for: °· Long-term heart problems. °· Long-term lung problems. °· Trouble controlling body systems, like breathing. °· Bleeding in the brain. °· A condition called cerebral palsy. °· Learning difficulties. °· Death. °These risks are highest for babies who are born before 34 weeks of pregnancy. °How is preterm labor treated? °Treatment depends on: °· How long you were pregnant. °· Your condition. °· The health of your baby. °Treatment may involve: °· Having a stitch (suture) placed in your cervix. When you give birth, your cervix opens so the baby can come out. The stitch keeps the cervix  from opening too soon. °· Staying at the hospital. °· Taking or getting medicines, such as: °¨ Hormone medicines. °¨ Medicines to stop contractions. °¨ Medicines to help the baby’s lungs develop. °¨ Medicines to prevent your baby from having cerebral palsy. °What should I do if I am in preterm labor? °If you think you are going into labor too soon, call your doctor right away. °How can I prevent preterm labor? °· Do not use any tobacco products. °¨ Examples of these are cigarettes, chewing tobacco, and e-cigarettes. °¨ If you need help quitting, ask your doctor. °· Do not use street drugs. °· Do not use any medicines unless you ask your doctor if they are safe for you. °· Talk with your doctor before taking any herbal supplements. °· Make sure you gain enough weight. °· Watch for infection. If you think you might have an infection, get it checked right away. °· If you have gone into preterm labor before, tell your doctor. °This information is not intended to replace advice given to you by your health care provider. Make sure you discuss any questions you have with your health care provider. °Document Released: 04/07/2008 Document Revised: 06/22/2015 Document Reviewed: 06/02/2015 °Elsevier Interactive Patient Education © 2017 [employer] ° °

## 2016-02-11 NOTE — MAU Note (Signed)
Urine in lab 

## 2016-02-11 NOTE — MAU Note (Signed)
Been having sporadic ctx's for the past 48 hrs, nothing consistent.  Has also been having diarrhea the past 48 hrs. (twice today, more formed)   Was 1 cm last wk.  Also? leaking

## 2016-02-16 ENCOUNTER — Other Ambulatory Visit: Payer: Self-pay | Admitting: Obstetrics and Gynecology

## 2016-02-21 ENCOUNTER — Encounter (HOSPITAL_COMMUNITY): Payer: Self-pay | Admitting: *Deleted

## 2016-02-21 ENCOUNTER — Inpatient Hospital Stay (HOSPITAL_COMMUNITY)
Admission: AD | Admit: 2016-02-21 | Discharge: 2016-02-21 | Disposition: A | Discharge status: Home / Self Care | Payer: 59 | Source: Ambulatory Visit | Attending: Obstetrics | Admitting: Obstetrics

## 2016-02-21 DIAGNOSIS — O4703 False labor before 37 completed weeks of gestation, third trimester: Secondary | ICD-10-CM

## 2016-02-21 DIAGNOSIS — Z3A35 35 weeks gestation of pregnancy: Secondary | ICD-10-CM | POA: Insufficient documentation

## 2016-02-21 DIAGNOSIS — Z79899 Other long term (current) drug therapy: Secondary | ICD-10-CM | POA: Insufficient documentation

## 2016-02-21 DIAGNOSIS — Z880 Allergy status to penicillin: Secondary | ICD-10-CM | POA: Insufficient documentation

## 2016-02-21 DIAGNOSIS — Z87891 Personal history of nicotine dependence: Secondary | ICD-10-CM | POA: Diagnosis not present

## 2016-02-21 DIAGNOSIS — R3129 Other microscopic hematuria: Secondary | ICD-10-CM | POA: Insufficient documentation

## 2016-02-21 DIAGNOSIS — O1213 Gestational proteinuria, third trimester: Secondary | ICD-10-CM | POA: Diagnosis not present

## 2016-02-21 DIAGNOSIS — O283 Abnormal ultrasonic finding on antenatal screening of mother: Secondary | ICD-10-CM

## 2016-02-21 DIAGNOSIS — O26893 Other specified pregnancy related conditions, third trimester: Secondary | ICD-10-CM | POA: Insufficient documentation

## 2016-02-21 LAB — COMPREHENSIVE METABOLIC PANEL
ALK PHOS: 132 U/L — AB (ref 38–126)
ALT: 13 U/L — ABNORMAL LOW (ref 14–54)
ANION GAP: 8 (ref 5–15)
AST: 20 U/L (ref 15–41)
Albumin: 2.7 g/dL — ABNORMAL LOW (ref 3.5–5.0)
BUN: 11 mg/dL (ref 6–20)
CO2: 24 mmol/L (ref 22–32)
Calcium: 9.5 mg/dL (ref 8.9–10.3)
Chloride: 105 mmol/L (ref 101–111)
Creatinine, Ser: 0.6 mg/dL (ref 0.44–1.00)
Glucose, Bld: 84 mg/dL (ref 65–99)
Potassium: 3.6 mmol/L (ref 3.5–5.1)
SODIUM: 137 mmol/L (ref 135–145)
TOTAL PROTEIN: 6.3 g/dL — AB (ref 6.5–8.1)
Total Bilirubin: 0.4 mg/dL (ref 0.3–1.2)

## 2016-02-21 LAB — URINALYSIS, MICROSCOPIC (REFLEX)

## 2016-02-21 LAB — URINALYSIS, ROUTINE W REFLEX MICROSCOPIC
Bilirubin Urine: NEGATIVE
GLUCOSE, UA: NEGATIVE mg/dL
KETONES UR: NEGATIVE mg/dL
NITRITE: NEGATIVE
PROTEIN: 30 mg/dL — AB
Specific Gravity, Urine: >1.03 — ABNORMAL HIGH (ref 1.005–1.030)
pH: 6 (ref 5.0–8.0)

## 2016-02-21 LAB — CBC
HCT: 28.2 % — ABNORMAL LOW (ref 36.0–46.0)
HEMOGLOBIN: 9.4 g/dL — AB (ref 12.0–15.0)
MCH: 25.1 pg — AB (ref 26.0–34.0)
MCHC: 33.3 g/dL (ref 30.0–36.0)
MCV: 75.2 fL — AB (ref 78.0–100.0)
Platelets: 344 10*3/uL (ref 150–400)
RBC: 3.75 MIL/uL — ABNORMAL LOW (ref 3.87–5.11)
RDW: 16.1 % — ABNORMAL HIGH (ref 11.5–15.5)
WBC: 12 10*3/uL — ABNORMAL HIGH (ref 4.0–10.5)

## 2016-02-21 LAB — PROTEIN / CREATININE RATIO, URINE
CREATININE, URINE: 138 mg/dL
PROTEIN CREATININE RATIO: 0.85 mg/mg{creat} — AB (ref 0.00–0.15)
TOTAL PROTEIN, URINE: 117 mg/dL

## 2016-02-21 MED ORDER — NALBUPHINE HCL 10 MG/ML IJ SOLN
10.0000 mg | INTRAMUSCULAR | Status: DC | PRN
  Administered 2016-02-21: 10 mg via INTRAVENOUS
  Filled 2016-02-21: qty 1

## 2016-02-21 NOTE — MAU Provider Note (Signed)
Chief Complaint:  Contractions   First Provider Initiated Contact with Patient 02/21/16 [phone]      HPI: Lori [name] is a 31 y.o. G1P0 at [redacted]w[redacted]d who presents to maternity admissions reporting abdominal and back pain that is constant, worse when standing or walking, and has kept her from sleeping all night last night. This is a recurrent problem.  She reports recent pain and pressure but it has never been this bad until today. She describes the pain as constant, low back and low abdominal pain that is gradually worsening over time and intermittently worse when she stands up or walks.  She has tried position changes and drinking more water but the pain is not improved.  She has hx of kidney stones but does not feel like this pain is similar to kidney stone pain.  She was admitted to the hospital 2 weeks ago and had BMZ injections for the baby's lungs. She was 1 cm dilated when she left the hospital and has not been reexamined at this time. She reports good fetal movement, denies LOF, vaginal bleeding, vaginal itching/burning, urinary symptoms, h/a, vision changes, dizziness, n/v, or fever/chills.    HPI  Past Medical History: Past Medical History:  Diagnosis Date  . Allergy   . Kidney stones   . Migraines     Past obstetric history: OB History  Gravida Para Term Preterm AB Living  1            SAB TAB Ectopic Multiple Live Births               # Outcome Date GA Lbr Len/2nd Weight Sex Delivery Anes PTL Lv  1 Current               Past Surgical History: Past Surgical History:  Procedure Laterality Date  . CHOLECYSTECTOMY  12/28/2010   Procedure: LAPAROSCOPIC CHOLECYSTECTOMY;  Surgeon: Almond Lint, MD;  Location: [facility];  Service: General;  Laterality: N/A;  . COLONOSCOPY  12/26/2010  . minor skin excision for a mole     . UPPER GASTROINTESTINAL ENDOSCOPY  12/26/2010  . WISDOM TOOTH EXTRACTION      Family History: Family History  Problem Relation Age of Onset  . Hypertension  Maternal Grandmother   . Diabetes Maternal Grandfather   . Hyperlipidemia Maternal Grandfather   . Heart disease Maternal Grandfather   . Hypertension Maternal Grandfather   . Stroke Maternal Grandfather     Social History: Social History  Substance Use Topics  . Smoking status: Former Smoker    Types: Cigarettes  . Smokeless tobacco: Former Neurosurgeon    Quit date: 12/26/2008  . Alcohol use No    Allergies:  Allergies  Allergen Reactions  . Bee Venom Anaphylaxis  . 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    . Amoxicillin-Pot Clavulanate Diarrhea and Other (See Comments)    Has patient had a PCN reaction causing immediate rash, facial/tongue/throat swelling, SOB or lightheadedness with hypotension: No Has patient had a PCN reaction causing severe rash involving mucus membranes or skin necrosis: No Has patient had a PCN reaction that required hospitalization No Has patient had a PCN reaction occurring within the last 10 years: No If all of the above answers are "NO", then may proceed with Cephalosporin use.  Marland Kitchen Morphine And Related Other (See Comments)    Reaction:  Caused pt to uncontrollably shake   . Penicillins Rash and Other (See Comments)  Has patient had a PCN reaction causing immediate rash, facial/tongue/throat swelling, SOB or lightheadedness with hypotension: No Has patient had a PCN reaction causing severe rash involving mucus membranes or skin necrosis: No Has patient had a PCN reaction that required hospitalization No Has patient had a PCN reaction occurring within the last 10 years: No If all of the above answers are "NO", then may proceed with Cephalosporin use.    Meds:  Prescriptions Prior to Admission  Medication Sig Dispense Refill Last Dose  . acetaminophen (TYLENOL) 500 MG tablet Take 1,000 mg by mouth every 6 (six) hours as needed for mild pain, moderate pain or headache.   Past Week at Unknown time  . calcium carbonate (TUMS  - DOSED IN MG ELEMENTAL CALCIUM) 500 MG chewable tablet Chew 1 tablet by mouth every 4 (four) hours as needed for indigestion or heartburn.   02/21/2016 at Unknown time  . hydrocortisone-pramoxine (PROCTOFOAM HC) rectal foam Place 1 applicator rectally 2 (two) times daily. (Patient taking differently: Place 1 applicator rectally 3 (three) times daily. ) 10 g 0 02/21/2016 at Unknown time  . levocetirizine (XYZAL) 5 MG tablet Take 5 mg by mouth daily.   02/20/2016 at Unknown time  . loratadine (CLARITIN) 10 MG tablet Take 10 mg by mouth daily.    02/20/2016 at Unknown time  . Prenatal MV-Min-FA-Omega-3 (PRENATAL GUMMIES/DHA & FA) 0.4-32.5 MG CHEW Chew 2 each by mouth daily.   02/21/2016 at Unknown time  . EPINEPHrine (EPIPEN 2-PAK) 0.3 mg/0.3 mL IJ SOAJ injection Inject 0.3 mg into the muscle once as needed (for severe allergic reaction).   rescue    ROS:  Review of Systems  Constitutional: Negative for chills, fatigue and fever.  Eyes: Negative for visual disturbance.  Respiratory: Negative for shortness of breath.   Cardiovascular: Negative for chest pain.  Gastrointestinal: Positive for abdominal pain. Negative for nausea and vomiting.  Genitourinary: Positive for pelvic pain. Negative for difficulty urinating, dysuria, flank pain, vaginal bleeding, vaginal discharge and vaginal pain.  Musculoskeletal: Positive for back pain.  Neurological: Negative for dizziness and headaches.  Psychiatric/Behavioral: Negative.      I have reviewed patient's Past Medical Hx, Surgical Hx, Family Hx, Social Hx, medications and allergies.   Physical Exam   Patient Vitals for the past 24 hrs:  BP Temp Temp src Pulse Resp  02/21/16 1212 132/78 - - 93 16  02/21/16 0834 129/80 - - 101 -  02/21/16 0819 126/83 - - 93 -  02/21/16 0812 131/91 98 F (36.7 C) Oral 105 20   Constitutional: Well-developed, well-nourished female in no acute distress.  Cardiovascular: normal rate Respiratory: normal effort GI: Abd  soft, non-tender, gravid appropriate for gestational age.  MS: Extremities nontender, no edema, normal ROM Neurologic: Alert and oriented x 4.  GU: Neg CVAT bilaterally   Dilation: 1 Effacement (%): 50 Cervical Position: Posterior Station: -3 Exam by:: L Leftwich-Kirby CNM  FHT:  Baseline 135, moderate variability, accelerations present, no decelerations FHT:  Baseline 140, moderate variability, accelerations present, no decelerations Contractions: None on toco or to palpation   Labs: Results for orders placed or performed during the hospital encounter of 02/21/16 (from the past 24 hour(s))  Urinalysis, Routine w reflex microscopic     Status: Abnormal   Collection Time: 02/21/16  8:00 AM  Result Value Ref Range   Color, Urine YELLOW YELLOW   APPearance CLOUDY (A) CLEAR   Specific Gravity, Urine >1.030 (H) 1.005 - 1.030   pH 6.0 5.0 -  8.0   Glucose, UA NEGATIVE NEGATIVE mg/dL   Hgb urine dipstick LARGE (A) NEGATIVE   Bilirubin Urine NEGATIVE NEGATIVE   Ketones, ur NEGATIVE NEGATIVE mg/dL   Protein, ur 30 (A) NEGATIVE mg/dL   Nitrite NEGATIVE NEGATIVE   Leukocytes, UA SMALL (A) NEGATIVE  Urinalysis, Microscopic (reflex)     Status: Abnormal   Collection Time: 02/21/16  8:00 AM  Result Value Ref Range   RBC / HPF 6-30 0 - 5 RBC/hpf   WBC, UA 0-5 0 - 5 WBC/hpf   Bacteria, UA FEW (A) NONE SEEN   Squamous Epithelial / LPF 0-5 (A) NONE SEEN  CBC     Status: Abnormal   Collection Time: 02/21/16  9:17 AM  Result Value Ref Range   WBC 12.0 (H) 4.0 - 10.5 K/uL   RBC 3.75 (L) 3.87 - 5.11 MIL/uL   Hemoglobin 9.4 (L) 12.0 - 15.0 g/dL   HCT 16.1 (L) 09.6 - 04.5 %   MCV 75.2 (L) 78.0 - 100.0 fL   MCH 25.1 (L) 26.0 - 34.0 pg   MCHC 33.3 30.0 - 36.0 g/dL   RDW 40.9 (H) 81.1 - 91.4 %   Platelets 344 150 - 400 K/uL  Comprehensive metabolic panel     Status: Abnormal   Collection Time: 02/21/16  9:17 AM  Result Value Ref Range   Sodium 137 135 - 145 mmol/L   Potassium 3.6 3.5 - 5.1  mmol/L   Chloride 105 101 - 111 mmol/L   CO2 24 22 - 32 mmol/L   Glucose, Bld 84 65 - 99 mg/dL   BUN 11 6 - 20 mg/dL   Creatinine, Ser 7.82 0.44 - 1.00 mg/dL   Calcium 9.5 8.9 - 95.6 mg/dL   Total Protein 6.3 (L) 6.5 - 8.1 g/dL   Albumin 2.7 (L) 3.5 - 5.0 g/dL   AST 20 15 - 41 U/L   ALT 13 (L) 14 - 54 U/L   Alkaline Phosphatase 132 (H) 38 - 126 U/L   Total Bilirubin 0.4 0.3 - 1.2 mg/dL   GFR calc non Af Amer >60 >60 mL/min   GFR calc Af Amer >60 >60 mL/min   Anion gap 8 5 - 15  Protein / creatinine ratio, urine     Status: Abnormal   Collection Time: 02/21/16 10:10 AM  Result Value Ref Range   Creatinine, Urine 138.00 mg/dL   Total Protein, Urine 117 mg/dL   Protein Creatinine Ratio 0.85 (H) 0.00 - 0.15 mg/mg[Cre]   --/--/B POS (01/14 1600)  Imaging:    MAU Course/MDM: I have ordered labs and reviewed results.  NST reviewed BP elevated x1, with normal BP x 3+ hours on repeat.  Preeclampsia labs ordered and CBC, CMP wnl but P/C ratio elevated at 0.85.  However, U/A contains large hgb so sample may be contaminated. Pt asymptomatic.   Blood in urine likely cervical r/t pregnancy.  Unlikely kidney stone b/c negative CVA tenderness and pt reports symptoms are not like her previous stones. Consult Dr Chestine Spore with presentation, exam findings and test results.  Nubain 10 mg IM x 1 dose, pt sleeping, reports pain resolved after medication D/C home with preeclampsia and preterm labor precautions, pt to f/u this week in office as scheduled. Pt stable at time of discharge.  Today's evaluation included a work-up for preterm labor which can be life-threatening for both mom and baby.  Assessment: 1. Threatened preterm labor, third trimester      2. Proteinuria affecting pregnancy  in third trimester   3. Hematuria, microscopic     Plan: Discharge home Labor precautions and fetal kick counts  Follow-up Information    [facility] Follow up.   Why:  As scheduled on 02/24/16.   Return to MAU as needed for signs of labor or emergencies. 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     Amoxicillin-pot Clavulanate Diarrhea, Other (See Comments)   Has patient had a PCN reaction causing immediate rash, facial/tongue/throat swelling, SOB or lightheadedness with hypotension: No Has patient had a PCN reaction causing severe rash involving mucus membranes or skin necrosis: No Has patient had a PCN reaction that required hospitalization No Has patient had a PCN reaction occurring within the last 10 years: No If all of the above answers are "NO", then may proceed with Cephalosporin use.   Morphine And Related Other (See Comments)   Reaction:  Caused pt to uncontrollably shake    Penicillins Rash, Other (See Comments)   Has patient had a PCN reaction causing immediate rash, facial/tongue/throat swelling, SOB or lightheadedness with hypotension: No Has patient had a PCN reaction causing severe rash involving mucus membranes or skin necrosis: No Has patient had a PCN reaction that required hospitalization No Has patient had a PCN reaction occurring within the last 10 years: No If all of the above answers are "NO", then may proceed with Cephalosporin use.      Medication List    TAKE these medications   acetaminophen 500 MG tablet Commonly known as:  TYLENOL Take 1,000 mg by mouth every 6 (six) hours as needed for mild pain, moderate pain or headache.   calcium carbonate 500 MG chewable tablet Commonly known as:  TUMS - dosed in mg elemental calcium Chew 1 tablet by mouth every 4 (four) hours as needed for indigestion or heartburn.   EPIPEN 2-PAK 0.3 mg/0.3 mL Soaj injection Generic drug:  EPINEPHrine Inject 0.3 mg into the muscle once as needed (for severe allergic reaction).    hydrocortisone-pramoxine rectal foam Commonly known as:  PROCTOFOAM HC Place 1 applicator rectally 2 (two) times daily. What changed:  when to take this   levocetirizine 5 MG tablet Commonly known as:  XYZAL Take 5 mg by mouth daily.   loratadine 10 MG tablet Commonly known as:  CLARITIN Take 10 mg by mouth daily.   PRENATAL GUMMIES/DHA & FA 0.4-32.5 MG Chew Chew 2 each by mouth daily.       Sharen Counter Certified Nurse-Midwife 02/21/2016 12:22 PM

## 2016-02-21 NOTE — MAU Note (Addendum)
Twins; c/o uc since 2100 last night; denies any vaginal bleeding or SROM

## 2016-02-21 NOTE — Discharge Instructions (Signed)

## 2016-02-21 NOTE — MAU Note (Signed)
Scheduled for primary c-section on Feb 16th;

## 2016-02-24 ENCOUNTER — Other Ambulatory Visit: Payer: Self-pay | Admitting: Obstetrics and Gynecology

## 2016-02-24 ENCOUNTER — Encounter (HOSPITAL_COMMUNITY): Payer: Self-pay

## 2016-02-24 ENCOUNTER — Inpatient Hospital Stay (HOSPITAL_COMMUNITY)
Admission: AD | Admit: 2016-02-24 | Discharge: 2016-02-24 | Disposition: A | Discharge status: Home / Self Care | Payer: 59 | Source: Ambulatory Visit | Attending: Obstetrics and Gynecology | Admitting: Obstetrics and Gynecology

## 2016-02-24 ENCOUNTER — Telehealth (HOSPITAL_COMMUNITY): Payer: Self-pay | Admitting: *Deleted

## 2016-02-24 DIAGNOSIS — O30003 Twin pregnancy, unspecified number of placenta and unspecified number of amniotic sacs, third trimester: Secondary | ICD-10-CM | POA: Diagnosis not present

## 2016-02-24 DIAGNOSIS — O163 Unspecified maternal hypertension, third trimester: Secondary | ICD-10-CM

## 2016-02-24 DIAGNOSIS — R030 Elevated blood-pressure reading, without diagnosis of hypertension: Secondary | ICD-10-CM | POA: Diagnosis not present

## 2016-02-24 DIAGNOSIS — Z3A35 35 weeks gestation of pregnancy: Secondary | ICD-10-CM | POA: Diagnosis not present

## 2016-02-24 DIAGNOSIS — O283 Abnormal ultrasonic finding on antenatal screening of mother: Secondary | ICD-10-CM

## 2016-02-24 DIAGNOSIS — O26893 Other specified pregnancy related conditions, third trimester: Secondary | ICD-10-CM | POA: Insufficient documentation

## 2016-02-24 DIAGNOSIS — O133 Gestational [pregnancy-induced] hypertension without significant proteinuria, third trimester: Secondary | ICD-10-CM | POA: Diagnosis not present

## 2016-02-24 DIAGNOSIS — O99013 Anemia complicating pregnancy, third trimester: Secondary | ICD-10-CM | POA: Insufficient documentation

## 2016-02-24 DIAGNOSIS — O99019 Anemia complicating pregnancy, unspecified trimester: Secondary | ICD-10-CM

## 2016-02-24 DIAGNOSIS — D509 Iron deficiency anemia, unspecified: Secondary | ICD-10-CM

## 2016-02-24 DIAGNOSIS — Z880 Allergy status to penicillin: Secondary | ICD-10-CM | POA: Insufficient documentation

## 2016-02-24 DIAGNOSIS — Z87891 Personal history of nicotine dependence: Secondary | ICD-10-CM | POA: Diagnosis not present

## 2016-02-24 LAB — URINALYSIS, ROUTINE W REFLEX MICROSCOPIC
Bilirubin Urine: NEGATIVE
Glucose, UA: NEGATIVE mg/dL
KETONES UR: NEGATIVE mg/dL
Nitrite: NEGATIVE
PROTEIN: 100 mg/dL — AB
Specific Gravity, Urine: 1.024 (ref 1.005–1.030)
pH: 5 (ref 5.0–8.0)

## 2016-02-24 LAB — COMPREHENSIVE METABOLIC PANEL
ALBUMIN: 2.7 g/dL — AB (ref 3.5–5.0)
ALK PHOS: 136 U/L — AB (ref 38–126)
ALT: 13 U/L — ABNORMAL LOW (ref 14–54)
ANION GAP: 8 (ref 5–15)
AST: 21 U/L (ref 15–41)
BILIRUBIN TOTAL: 0.3 mg/dL (ref 0.3–1.2)
BUN: 11 mg/dL (ref 6–20)
CALCIUM: 9.1 mg/dL (ref 8.9–10.3)
CO2: 22 mmol/L (ref 22–32)
Chloride: 106 mmol/L (ref 101–111)
Creatinine, Ser: 0.68 mg/dL (ref 0.44–1.00)
GFR calc non Af Amer: >60 mL/min (ref >60)
GLUCOSE: 89 mg/dL (ref 65–99)
Potassium: 3.8 mmol/L (ref 3.5–5.1)
Sodium: 136 mmol/L (ref 135–145)
TOTAL PROTEIN: 6.3 g/dL — AB (ref 6.5–8.1)

## 2016-02-24 LAB — CBC
HCT: 28.7 % — ABNORMAL LOW (ref 36.0–46.0)
HEMOGLOBIN: 9.3 g/dL — AB (ref 12.0–15.0)
MCH: 24.1 pg — ABNORMAL LOW (ref 26.0–34.0)
MCHC: 32.4 g/dL (ref 30.0–36.0)
MCV: 74.4 fL — ABNORMAL LOW (ref 78.0–100.0)
Platelets: 344 10*3/uL (ref 150–400)
RBC: 3.86 MIL/uL — ABNORMAL LOW (ref 3.87–5.11)
RDW: 16.3 % — ABNORMAL HIGH (ref 11.5–15.5)
WBC: 11.2 10*3/uL — ABNORMAL HIGH (ref 4.0–10.5)

## 2016-02-24 LAB — PROTEIN / CREATININE RATIO, URINE
CREATININE, URINE: 208 mg/dL
Protein Creatinine Ratio: 0.49 mg/mg{Cre} — ABNORMAL HIGH (ref 0.00–0.15)
Total Protein, Urine: 101 mg/dL

## 2016-02-24 MED ORDER — FERROUS SULFATE 325 (65 FE) MG PO TABS: 325.0000 mg | ORAL_TABLET | Freq: Every day | ORAL | 3 refills | Status: DC

## 2016-02-24 NOTE — Telephone Encounter (Signed)
Preadmission screen  

## 2016-02-24 NOTE — MAU Note (Signed)
Pt sent from office for elevated b/p and protein in her urine. Denies headache or visual changes.

## 2016-02-24 NOTE — Discharge Instructions (Signed)
Preeclampsia and Eclampsia  Preeclampsia is a serious condition that develops only during pregnancy. It is also called toxemia of pregnancy. This condition causes high blood pressure along with other symptoms, such as swelling and headaches. These symptoms may develop as the condition gets worse. Preeclampsia may occur at 20 weeks of pregnancy or later.  Diagnosing and treating preeclampsia early is very important. If not treated early, it can cause serious problems for you and your baby. One problem it can lead to is eclampsia, which is a condition that causes muscle jerking or shaking (convulsions or seizures) in the mother. Delivering your baby is the best treatment for preeclampsia or eclampsia. Preeclampsia and eclampsia symptoms usually go away after your baby is born.  What are the causes?  The cause of preeclampsia is not known.  What increases the risk?  The following risk factors make you more likely to develop preeclampsia:  · Being pregnant for the first time.  · Having had preeclampsia during a past pregnancy.  · Having a family history of preeclampsia.  · Having high blood pressure.  · Being pregnant with twins or triplets.  · Being 35 or older.  · Being African-American.  · Having kidney disease or diabetes.  · Having medical conditions such as lupus or blood diseases.  · Being very overweight (obese).    What are the signs or symptoms?  The earliest signs of preeclampsia are:  · High blood pressure.  · Increased protein in your urine. Your health care provider will check for this at every visit before you give birth (prenatal visit).    Other symptoms that may develop as the condition gets worse include:  · Severe headaches.  · Sudden weight gain.  · Swelling of the hands, face, legs, and feet.  · Nausea and vomiting.  · Vision problems, such as blurred or double vision.  · Numbness in the face, arms, legs, and feet.  · Urinating less than usual.  · Dizziness.  · Slurred speech.  · Abdominal pain,  especially upper abdominal pain.  · Convulsions or seizures.    Symptoms generally go away after giving birth.  How is this diagnosed?  There are no screening tests for preeclampsia. Your health care provider will ask you about symptoms and check for signs of preeclampsia during your prenatal visits. You may also have tests that include:  · Urine tests.  · Blood tests.  · Checking your blood pressure.  · Monitoring your baby’s heart rate.  · Ultrasound.    How is this treated?  You and your health care provider will determine the treatment approach that is best for you. Treatment may include:  · Having more frequent prenatal exams to check for signs of preeclampsia, if you have an increased risk for preeclampsia.  · Bed rest.  · Reducing how much salt (sodium) you eat.  · Medicine to lower your blood pressure.  · Staying in the hospital, if your condition is severe. There, treatment will focus on controlling your blood pressure and the amount of fluids in your body (fluid retention).  · You may need to take medicine (magnesium sulfate) to prevent seizures. This medicine may be given as an injection or through an IV tube.  · Delivering your baby early, if your condition gets worse. You may have your labor started with medicine (induced), or you may have a cesarean delivery.    Follow these instructions at home:  Eating and drinking     ·   quitting, ask your health care provider.  Do not use alcohol or drugs.  Avoid stress as much as possible. Rest and get plenty of sleep. General instructions  Take over-the-counter and prescription medicines only as told by your health  care provider.  When lying down, lie on your side. This keeps pressure off of your baby.  When sitting or lying down, raise (elevate) your feet. Try putting some pillows underneath your lower legs.  Exercise regularly. Ask your health care provider what kinds of exercise are best for you.  Keep all follow-up and prenatal visits as told by your health care provider. This is important. How is this prevented? To prevent preeclampsia or eclampsia from developing during another pregnancy:  Get proper medical care during pregnancy. Your health care provider may be able to prevent preeclampsia or diagnose and treat it early.  Your health care provider may have you take a low-dose aspirin or a calcium supplement during your next pregnancy.  You may have tests of your blood pressure and kidney function after giving birth.  Maintain a healthy weight. Ask your health care provider for help managing weight gain during pregnancy.  Work with your health care provider to manage any long-term (chronic) health conditions you have, such as diabetes or kidney problems. Contact a health care provider if:  You gain more weight than expected.  You have headaches.  You have nausea or vomiting.  You have abdominal pain.  You feel dizzy or light-headed. Get help right away if:  You develop sudden or severe swelling anywhere in your body. This usually happens in the legs.  You gain 5 lbs (2.3 kg) or more during one week.  You have severe:  Abdominal pain.  Headaches.  Dizziness.  Vision problems.  Confusion.  Nausea or vomiting.  You have a seizure.  You have trouble moving any part of your body.  You develop numbness in any part of your body.  You have trouble speaking.  You have any abnormal bleeding.  You pass out. This information is not intended to replace advice given to you by your health care provider. Make sure you discuss any questions you have with your health care  provider. Document Released: 01/07/2000 Document Revised: 09/07/2015 Document Reviewed: 08/16/2015 Elsevier Interactive Patient Education  2017 [employer]    Iron Deficiency Anemia, Adult Iron-deficiency anemia is when you have a low amount of red blood cells or hemoglobin. This happens because you have too little iron in your body. Hemoglobin carries oxygen to parts of the body. Anemia can cause your body to not get enough oxygen. It may or may not cause symptoms. Follow these instructions at home: Medicines  Take over-the-counter and prescription medicines only as told by your doctor. This includes iron pills (supplements) and vitamins.  If you cannot handle taking iron pills by mouth, ask your doctor about getting iron through:  A vein (intravenously).  A shot (injection) into a muscle.  Take iron pills when your stomach is empty. If you cannot handle this, take them with food.  Do not drink milk or take antacids at the same time as your iron pills.  To prevent trouble pooping (constipation), eat fiber or take medicine (stool softener) as told by your doctor. Eating and drinking  Talk with your doctor before changing the foods you eat. He or she may tell you to eat foods that have a lot of iron, such as:  Liver.  Lowfat (lean) beef.  Breads and cereals that  have iron added to them (fortified breads and cereals).  Eggs.  Dried fruit.  Dark green, leafy vegetables.  Drink enough fluid to keep your pee (urine) clear or pale yellow.  Eat fresh fruits and vegetables that are high in vitamin C. They help your body to use iron. Foods with a lot of vitamin C include:  Oranges.  Peppers.  Tomatoes.  Mangoes. General instructions  Return to your normal activities as told by your doctor. Ask your doctor what activities are safe for you.  Keep yourself clean, and keep things clean around you (your surroundings). Anemia can make you get sick more easily.  Keep all  follow-up visits as told by your doctor. This is important. Contact a doctor if:  You feel sick to your stomach (nauseous).  You throw up (vomit).  You feel weak.  You are sweating for no clear reason.  You have trouble pooping, such as:  Pooping (having a bowel movement) less than 3 times a week.  Straining to poop.  Having poop that is hard, dry, or larger than normal.  Feeling full or bloated.  Pain in the lower belly.  Not feeling better after pooping. Get help right away if:  You pass out (faint). If this happens, do not drive yourself to the hospital. Call your local emergency services (911 in the [place]  You have chest pain.  You have shortness of breath that:  Is very bad.  Gets worse with physical activity.  You have a fast heartbeat.  You get light-headed when getting up from sitting or lying down. This information is not intended to replace advice given to you by your health care provider. Make sure you discuss any questions you have with your health care provider. Document Released: 02/11/2010 Document Revised: 09/29/2015 Document Reviewed: 09/29/2015 Elsevier Interactive Patient Education  2017 [employer].

## 2016-02-24 NOTE — MAU Provider Note (Signed)
Chief Complaint:  Hypertension   First Provider Initiated Contact with Patient 02/24/16 1640      HPI: Lori [name] is a 31 y.o. G1P0 at [redacted]w[redacted]d who presents to maternity admissions reporting sent in from office for HTN with proteinuria and rule out preeclampsia.  Patient is pregnant with twins. She was seen in the office and had some mildly elevated BPs, with protein in her urine, was sent in to rule out preeclampsia. Patient denies any headaches, chest pain, SOB, changes in vision, RUQ/epigastric pain. She states she recently had borderline elevated blood pressures but never had HTN before.   Denies contractions, leakage of fluid or vaginal bleeding. Good fetal movement x2.   Pregnancy Course:   Past Medical History: Past Medical History:  Diagnosis Date  . Allergy   . History of shingles    as a child  . Kidney stone   . Kidney stones   . Migraines     Past obstetric history: OB History  Gravida Para Term Preterm AB Living  1            SAB TAB Ectopic Multiple Live Births               # Outcome Date GA Lbr Len/2nd Weight Sex Delivery Anes PTL Lv  1 Current               Past Surgical History: Past Surgical History:  Procedure Laterality Date  . CHOLECYSTECTOMY  12/28/2010   Procedure: LAPAROSCOPIC CHOLECYSTECTOMY;  Surgeon: Almond Lint, MD;  Location: [facility];  Service: General;  Laterality: N/A;  . COLONOSCOPY  12/26/2010  . minor skin excision for a mole     . UPPER GASTROINTESTINAL ENDOSCOPY  12/26/2010  . WISDOM TOOTH EXTRACTION       Family History: Family History  Problem Relation Age of Onset  . Hypertension Maternal Grandmother   . Diabetes Maternal Grandfather   . Hyperlipidemia Maternal Grandfather   . Heart disease Maternal Grandfather   . Hypertension Maternal Grandfather   . Stroke Maternal Grandfather     Social History: Social History  Substance Use Topics  . Smoking status: Former Smoker    Types: Cigarettes  . Smokeless tobacco:  Former Neurosurgeon    Quit date: 12/26/2008  . Alcohol use No    Allergies:  Allergies  Allergen Reactions  . Bee Venom Anaphylaxis  . 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    . Amoxicillin-Pot Clavulanate Diarrhea and Other (See Comments)    Has patient had a PCN reaction causing immediate rash, facial/tongue/throat swelling, SOB or lightheadedness with hypotension: No Has patient had a PCN reaction causing severe rash involving mucus membranes or skin necrosis: No Has patient had a PCN reaction that required hospitalization No Has patient had a PCN reaction occurring within the last 10 years: No If all of the above answers are "NO", then may proceed with Cephalosporin use.  Marland Kitchen Morphine And Related Other (See Comments)    Reaction:  Caused pt to uncontrollably shake   . Penicillins Rash and Other (See Comments)    Has patient had a PCN reaction causing immediate rash, facial/tongue/throat swelling, SOB or lightheadedness with hypotension: No Has patient had a PCN reaction causing severe rash involving mucus membranes or skin necrosis: No Has patient had a PCN reaction that required hospitalization No Has patient had a PCN reaction occurring within the last 10 years: No If all of the above  answers are "NO", then may proceed with Cephalosporin use.    Meds:  Prescriptions Prior to Admission  Medication Sig Dispense Refill Last Dose  . acetaminophen (TYLENOL) 500 MG tablet Take 1,000 mg by mouth every 6 (six) hours as needed for mild pain, moderate pain or headache.   Past Week at Unknown time  . calcium carbonate (TUMS - DOSED IN MG ELEMENTAL CALCIUM) 500 MG chewable tablet Chew 1 tablet by mouth every 4 (four) hours as needed for indigestion or heartburn.   02/24/2016 at Unknown time  . docusate sodium (COLACE) 100 MG capsule Take 200 mg by mouth daily.   02/23/2016 at Unknown time  . hydrocortisone-pramoxine (PROCTOFOAM HC) rectal foam Place 1 applicator  rectally 2 (two) times daily. (Patient taking differently: Place 1 applicator rectally 3 (three) times daily. ) 10 g 0 02/24/2016 at Unknown time  . levocetirizine (XYZAL) 5 MG tablet Take 5 mg by mouth daily.   02/23/2016 at Unknown time  . loratadine (CLARITIN) 10 MG tablet Take 10 mg by mouth daily.    Past Week at Unknown time  . Prenatal Vit-Fe Fumarate-FA (PRENATAL MULTIVITAMIN) TABS tablet Take 1 tablet by mouth daily at 12 noon.   02/24/2016 at Unknown time  . EPINEPHrine (EPIPEN 2-PAK) 0.3 mg/0.3 mL IJ SOAJ injection Inject 0.3 mg into the muscle once as needed (for severe allergic reaction).   Emergency    I have reviewed patient's Past Medical Hx, Surgical Hx, Family Hx, Social Hx, medications and allergies.   ROS:  A comprehensive ROS was negative except per HPI.    Physical Exam  Patient Vitals for the past 24 hrs:  BP Temp Pulse Resp Weight  02/24/16 1715 123/80 - 99 - -  02/24/16 1700 107/93 - 103 - -  02/24/16 1645 131/76 - 96 - -  02/24/16 1630 121/85 - 98 - -  02/24/16 1600 145/89 97.8 F (36.6 C) 88 18 232 lb 1.3 oz (105.3 kg)   Constitutional: Well-developed, well-nourished female in no acute distress.  Cardiovascular: normal rate, rhythm, no murmurs Respiratory: normal effort, CTAB GI: Abd soft, non-tender, gravid appropriate for twin gestational age. Pos BS x 4. No RUQ/epigastric pain. MS: Extremities nontender, no edema, normal ROM Neurologic: Alert and oriented x 4. Good patellar reflexes. GU: Neg CVAT.    Labs: Results for orders placed or performed during the hospital encounter of 02/24/16 (from the past 24 hour(s))  Urinalysis, Routine w reflex microscopic     Status: Abnormal   Collection Time: 02/24/16  3:55 PM  Result Value Ref Range   Color, Urine AMBER (A) YELLOW   APPearance CLOUDY (A) CLEAR   Specific Gravity, Urine 1.024 1.005 - 1.030   pH 5.0 5.0 - 8.0   Glucose, UA NEGATIVE NEGATIVE mg/dL   Hgb urine dipstick SMALL (A) NEGATIVE   Bilirubin  Urine NEGATIVE NEGATIVE   Ketones, ur NEGATIVE NEGATIVE mg/dL   Protein, ur 086 (A) NEGATIVE mg/dL   Nitrite NEGATIVE NEGATIVE   Leukocytes, UA TRACE (A) NEGATIVE   RBC / HPF TOO NUMEROUS TO COUNT 0 - 5 RBC/hpf   WBC, UA TOO NUMEROUS TO COUNT 0 - 5 WBC/hpf   Bacteria, UA RARE (A) NONE SEEN   Squamous Epithelial / LPF TOO NUMEROUS TO COUNT (A) NONE SEEN   Mucous PRESENT    Ca Oxalate Crys, UA PRESENT   Protein / creatinine ratio, urine     Status: Abnormal   Collection Time: 02/24/16  3:55 PM  Result Value  Ref Range   Creatinine, Urine 208.00 mg/dL   Total Protein, Urine 101 mg/dL   Protein Creatinine Ratio 0.49 (H) 0.00 - 0.15 mg/mg[Cre]  Comprehensive metabolic panel     Status: Abnormal   Collection Time: 02/24/16  4:34 PM  Result Value Ref Range   Sodium 136 135 - 145 mmol/L   Potassium 3.8 3.5 - 5.1 mmol/L   Chloride 106 101 - 111 mmol/L   CO2 22 22 - 32 mmol/L   Glucose, Bld 89 65 - 99 mg/dL   BUN 11 6 - 20 mg/dL   Creatinine, Ser 1.61 0.44 - 1.00 mg/dL   Calcium 9.1 8.9 - 09.6 mg/dL   Total Protein 6.3 (L) 6.5 - 8.1 g/dL   Albumin 2.7 (L) 3.5 - 5.0 g/dL   AST 21 15 - 41 U/L   ALT 13 (L) 14 - 54 U/L   Alkaline Phosphatase 136 (H) 38 - 126 U/L   Total Bilirubin 0.3 0.3 - 1.2 mg/dL   GFR calc non Af Amer >60 >60 mL/min   GFR calc Af Amer >60 >60 mL/min   Anion gap 8 5 - 15  CBC     Status: Abnormal   Collection Time: 02/24/16  4:34 PM  Result Value Ref Range   WBC 11.2 (H) 4.0 - 10.5 K/uL   RBC 3.86 (L) 3.87 - 5.11 MIL/uL   Hemoglobin 9.3 (L) 12.0 - 15.0 g/dL   HCT 04.5 (L) 40.9 - 81.1 %   MCV 74.4 (L) 78.0 - 100.0 fL   MCH 24.1 (L) 26.0 - 34.0 pg   MCHC 32.4 30.0 - 36.0 g/dL   RDW 91.4 (H) 78.2 - 95.6 %   Platelets 344 150 - 400 K/uL    Imaging:  [place] Mfm Ob Transvaginal  Result Date: 02/10/2016 ----------------------------------------------------------------------  OBSTETRICS REPORT                        (Corrected Final 02/10/2016 02:08                                                                           pm) ---------------------------------------------------------------------- Patient Info  ID #:       [id number]                          D.O.B.:  10-Nov-1985 (30 yrs)  Name:       Lori [name]               Visit Date: 02/06/2016 02:42 pm ---------------------------------------------------------------------- Performed By  Performed By:     Raoul Pitch        Ref. Address:     [address]                    [place]                                                             [id number]  Attending:  Charlsie Merles MD         Secondary Phy.:   MAU Nursing-                                                             MAU/Triage  Referred By:      Essie Hart MD          Location:         [facility] ---------------------------------------------------------------------- Orders   #  Description                                 Code   1  [place] MFM OB LIMITED                           76815.01   2  [place] MFM OB TRANSVAGINAL                      16109.6  ----------------------------------------------------------------------   #  Ordered By               Order #        Accession #    Episode #   1  Essie Hart               [id number]      [id number]     [id number]   2  WALDA PINN               [id number]      [id number]     [id number]  ---------------------------------------------------------------------- Indications   [redacted] weeks gestation of pregnancy                Z3A.32   Threatened preterm labor, antepartum           O66.00   Twin pregnancy, di/di, third trimester         O30.043  ---------------------------------------------------------------------- OB History  Gravidity:    1 ---------------------------------------------------------------------- Fetal Evaluation (Fetus A)  Num Of Fetuses:     2  Fetal Heart         155  Rate(bpm):  Cardiac Activity:   Observed  Fetal Lie:          Maternal right side  Presentation:       Cephalic  Placenta:           Posterior, above  cervical os  Membrane Desc:      Dividing Membrane seen  Amniotic Fluid  AFI FV:      Subjectively within normal limits                              Largest Pocket(cm)                              3.17 ---------------------------------------------------------------------- Gestational Age (Fetus A)  LMP:           32w 6d        Date:  06/21/15                 EDD:   03/27/16  Best:  32w 6d     Det. By:  LMP  (06/21/15)          EDD:   03/27/16 ---------------------------------------------------------------------- Fetal Evaluation (Fetus B)  Num Of Fetuses:     2  Fetal Heart         136  Rate(bpm):  Cardiac Activity:   Observed  Fetal Lie:          Maternal left side  Presentation:       Breech  Placenta:           Posterior, above cervical os  Membrane Desc:      Dividing Membrane seen  Amniotic Fluid  AFI FV:      Subjectively within normal limits                              Largest Pocket(cm)                              2.41 ---------------------------------------------------------------------- Gestational Age (Fetus B)  LMP:           32w 6d        Date:  06/21/15                 EDD:   03/27/16  Best:          Armida Sans 6d     Det. By:  LMP  (06/21/15)          EDD:   03/27/16 ---------------------------------------------------------------------- Cervix Uterus Adnexa  Cervix  Length:           3.53  cm.  Normal appearance by transvaginal scan  Cul De Sac:   No free fluid seen. ---------------------------------------------------------------------- Impression  Diamniotic Dichorionic intrauterine pregnancy at weeks  Presentation is cephalic/breech  Normal amniotic fluid in each sac, with  MVP of 3.2 cm for A  and 2.4 cm for B  There is normal fetal movement and cardiac activity on both  twins  A transvaginal evaluation of the cervix showed lengths from  29-35 mm with no funnelling ---------------------------------------------------------------------- Recommendations  No evidence of fetal compromise. the use of  transvaginal  ceervical length to predict the likelihood of preterm delivery  has not been validated in multifetal gestation. Similarly, the  use of transvaginal cervical length after 28 weeks is of little or  no utility for the prediction of preterm delivery risk ----------------------------------------------------------------------               Fayne Norrie, BS, RDMS, RVT Electronically Signed Corrected Final Report  02/10/2016 02:08 pm ----------------------------------------------------------------------  [place] Mfm Ob Limited  Result Date: 02/10/2016 ----------------------------------------------------------------------  OBSTETRICS REPORT                        (Corrected Final 02/10/2016 02:08                                                                          pm) ---------------------------------------------------------------------- Patient Info  ID #:       [id number]  D.O.B.:  1985-08-11 (30 yrs)  Name:       Lori [name]               Visit Date: 02/06/2016 02:42 pm ---------------------------------------------------------------------- Performed By  Performed By:     Raoul Pitch        Ref. Address:     [address]                    [place]                                                             [id number]  Attending:        Charlsie Merles MD         Secondary Phy.:   MAU Nursing-                                                             MAU/Triage  Referred By:      Essie Hart MD          Location:         [facility] ---------------------------------------------------------------------- Orders   #  Description                                 Code   1  [place] MFM OB LIMITED                           76815.01   2  [place] MFM OB TRANSVAGINAL                      16109.6  ----------------------------------------------------------------------   #  Ordered By               Order #        Accession #    Episode #   1  Essie Hart               [id number]      [id number]      [id number]   2  WALDA PINN               [id number]      [id number]     [id number]  ---------------------------------------------------------------------- Indications   [redacted] weeks gestation of pregnancy                Z3A.32   Threatened preterm labor, antepartum           O2.00   Twin pregnancy, di/di, third trimester         O30.043  ---------------------------------------------------------------------- OB History  Gravidity:    1 ---------------------------------------------------------------------- Fetal Evaluation (Fetus A)  Num Of Fetuses:     2  Fetal Heart         155  Rate(bpm):  Cardiac Activity:   Observed  Fetal Lie:          Maternal right side  Presentation:       Cephalic  Placenta:  Posterior, above cervical os  Membrane Desc:      Dividing Membrane seen  Amniotic Fluid  AFI FV:      Subjectively within normal limits                              Largest Pocket(cm)                              3.17 ---------------------------------------------------------------------- Gestational Age (Fetus A)  LMP:           32w 6d        Date:  06/21/15                 EDD:   03/27/16  Best:          Armida Sans 6d     Det. By:  LMP  (06/21/15)          EDD:   03/27/16 ---------------------------------------------------------------------- Fetal Evaluation (Fetus B)  Num Of Fetuses:     2  Fetal Heart         136  Rate(bpm):  Cardiac Activity:   Observed  Fetal Lie:          Maternal left side  Presentation:       Breech  Placenta:           Posterior, above cervical os  Membrane Desc:      Dividing Membrane seen  Amniotic Fluid  AFI FV:      Subjectively within normal limits                              Largest Pocket(cm)                              2.41 ---------------------------------------------------------------------- Gestational Age (Fetus B)  LMP:           32w 6d        Date:  06/21/15                 EDD:   03/27/16  Best:          Armida Sans 6d     Det. By:  LMP  (06/21/15)          EDD:   03/27/16  ---------------------------------------------------------------------- Cervix Uterus Adnexa  Cervix  Length:           3.53  cm.  Normal appearance by transvaginal scan  Cul De Sac:   No free fluid seen. ---------------------------------------------------------------------- Impression  Diamniotic Dichorionic intrauterine pregnancy at weeks  Presentation is cephalic/breech  Normal amniotic fluid in each sac, with  MVP of 3.2 cm for A  and 2.4 cm for B  There is normal fetal movement and cardiac activity on both  twins  A transvaginal evaluation of the cervix showed lengths from  29-35 mm with no funnelling ---------------------------------------------------------------------- Recommendations  No evidence of fetal compromise. the use of transvaginal  ceervical length to predict the likelihood of preterm delivery  has not been validated in multifetal gestation. Similarly, the  use of transvaginal cervical length after 28 weeks is of little or  no utility for the prediction of preterm delivery risk ----------------------------------------------------------------------               Fayne Norrie, BS, RDMS, RVT Electronically Signed  Corrected Final Report  02/10/2016 02:08 pm ----------------------------------------------------------------------   MAU Course: CBC - Normal platelets, + microcytic anemia CMP - normal LFTs and creatinine UPC- 0. 49 (from 0.85) BPs- initial elevated, majority normal range, with one DBP 93. Exam WNL NST - TWIN A & B REACTIVE with category I strips  5:41 PM - Discussed with Dr. Tenny Craw regarding BPs and lab results. Plan is to send patient home with a 24 hour urine that she will start on Sunday, and bring in to the office on Monday morning. She will have a follow up BP check on Monday and turn in her urine. Preeclampsia precautions given for the weekend in the meantime.    I personally reviewed the patient's NST today, found to be REACTIVE. TWIN A- 145 bpm, mod var, +accels, no  decels. TWIN B- 145 bpm, mod var, +accels, no decels. CTX: None.   MDM: Plan of care reviewed with patient, including labs and tests ordered and medical treatment. Discussed with patient return precautions for preeclampsia symptoms. Patient has BP cuff at home, discussed taking 2-3 BPs randomly at home during the day over the weekend, especially if symptoms developed, and strict return precautions given if symptoms develop or worsening BPs. Discussed 24 hour urine collection and to start on Sunday, to bring into office on Monday. Follow up in office on Monday for BP check.  Fetal movement counts discussed.   Assessment: 1. Elevated blood pressure affecting pregnancy in third trimester, antepartum   2. Increased nuchal translucency space on fetal ultrasound   3. Iron deficiency anemia during pregnancy     Plan: Discharge home in stable condition.  Preterm Labor precautions and fetal kick counts Preeclampsia precautions given. 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     Amoxicillin-pot Clavulanate Diarrhea, Other (See Comments)   Has patient had a PCN reaction causing immediate rash, facial/tongue/throat swelling, SOB or lightheadedness with hypotension: No Has patient had a PCN reaction causing severe rash involving mucus membranes or skin necrosis: No Has patient had a PCN reaction that required hospitalization No Has patient had a PCN reaction occurring within the last 10 years: No If all of the above answers are "NO", then may proceed with Cephalosporin use.   Morphine And Related Other (See Comments)   Reaction:  Caused pt to uncontrollably shake    Penicillins Rash, Other (See Comments)   Has patient had a PCN reaction causing immediate rash, facial/tongue/throat swelling, SOB or lightheadedness with  hypotension: No Has patient had a PCN reaction causing severe rash involving mucus membranes or skin necrosis: No Has patient had a PCN reaction that required hospitalization No Has patient had a PCN reaction occurring within the last 10 years: No If all of the above answers are "NO", then may proceed with Cephalosporin use.      Medication List    TAKE these medications   acetaminophen 500 MG tablet Commonly known as:  TYLENOL Take 1,000 mg by mouth every 6 (six) hours as needed for mild pain, moderate pain or headache.   calcium carbonate 500 MG chewable tablet Commonly known as:  TUMS - dosed in mg elemental calcium Chew 1 tablet by mouth every 4 (four) hours as needed for indigestion or heartburn.   docusate sodium 100 MG capsule  Commonly known as:  COLACE Take 200 mg by mouth daily.   EPIPEN 2-PAK 0.3 mg/0.3 mL Soaj injection Generic drug:  EPINEPHrine Inject 0.3 mg into the muscle once as needed (for severe allergic reaction).   ferrous sulfate 325 (65 FE) MG tablet Take 1 tablet (325 mg total) by mouth daily with breakfast.   hydrocortisone-pramoxine rectal foam Commonly known as:  PROCTOFOAM HC Place 1 applicator rectally 2 (two) times daily. What changed:  when to take this   levocetirizine 5 MG tablet Commonly known as:  XYZAL Take 5 mg by mouth daily.   loratadine 10 MG tablet Commonly known as:  CLARITIN Take 10 mg by mouth daily.   prenatal multivitamin Tabs tablet Take 1 tablet by mouth daily at 12 noon.       Jen MowElizabeth Mumaw, DO OB Fellow [facility] 02/24/2016 5:41 PM

## 2016-02-25 ENCOUNTER — Other Ambulatory Visit: Payer: Self-pay | Admitting: Obstetrics and Gynecology

## 2016-02-26 ENCOUNTER — Encounter (HOSPITAL_COMMUNITY): Payer: Self-pay | Admitting: Certified Nurse Midwife

## 2016-02-26 ENCOUNTER — Inpatient Hospital Stay (HOSPITAL_COMMUNITY)
Admission: AD | Admit: 2016-02-26 | Discharge: 2016-02-26 | Disposition: A | Discharge status: Home / Self Care | Payer: 59 | Source: Ambulatory Visit | Attending: Obstetrics and Gynecology | Admitting: Obstetrics and Gynecology

## 2016-02-26 DIAGNOSIS — Z880 Allergy status to penicillin: Secondary | ICD-10-CM | POA: Diagnosis not present

## 2016-02-26 DIAGNOSIS — Z87891 Personal history of nicotine dependence: Secondary | ICD-10-CM | POA: Diagnosis not present

## 2016-02-26 DIAGNOSIS — O1493 Unspecified pre-eclampsia, third trimester: Secondary | ICD-10-CM | POA: Diagnosis not present

## 2016-02-26 DIAGNOSIS — R030 Elevated blood-pressure reading, without diagnosis of hypertension: Secondary | ICD-10-CM | POA: Diagnosis present

## 2016-02-26 DIAGNOSIS — O30043 Twin pregnancy, dichorionic/diamniotic, third trimester: Secondary | ICD-10-CM | POA: Insufficient documentation

## 2016-02-26 DIAGNOSIS — Z3A35 35 weeks gestation of pregnancy: Secondary | ICD-10-CM | POA: Insufficient documentation

## 2016-02-26 LAB — CBC
HCT: 29 % — ABNORMAL LOW (ref 36.0–46.0)
Hemoglobin: 9.2 g/dL — ABNORMAL LOW (ref 12.0–15.0)
MCH: 23.6 pg — AB (ref 26.0–34.0)
MCHC: 31.7 g/dL (ref 30.0–36.0)
MCV: 74.4 fL — AB (ref 78.0–100.0)
PLATELETS: 375 10*3/uL (ref 150–400)
RBC: 3.9 MIL/uL (ref 3.87–5.11)
RDW: 16.4 % — AB (ref 11.5–15.5)
WBC: 12.9 10*3/uL — ABNORMAL HIGH (ref 4.0–10.5)

## 2016-02-26 LAB — COMPREHENSIVE METABOLIC PANEL
ALT: 14 U/L (ref 14–54)
ANION GAP: 8 (ref 5–15)
AST: 21 U/L (ref 15–41)
Albumin: 2.6 g/dL — ABNORMAL LOW (ref 3.5–5.0)
Alkaline Phosphatase: 131 U/L — ABNORMAL HIGH (ref 38–126)
BUN: 12 mg/dL (ref 6–20)
CALCIUM: 9 mg/dL (ref 8.9–10.3)
CO2: 23 mmol/L (ref 22–32)
CREATININE: 0.59 mg/dL (ref 0.44–1.00)
Chloride: 105 mmol/L (ref 101–111)
Glucose, Bld: 103 mg/dL — ABNORMAL HIGH (ref 65–99)
Potassium: 3.8 mmol/L (ref 3.5–5.1)
SODIUM: 136 mmol/L (ref 135–145)
Total Bilirubin: 0.4 mg/dL (ref 0.3–1.2)
Total Protein: 5.8 g/dL — ABNORMAL LOW (ref 6.5–8.1)

## 2016-02-26 LAB — URINALYSIS, ROUTINE W REFLEX MICROSCOPIC
BILIRUBIN URINE: NEGATIVE
GLUCOSE, UA: NEGATIVE mg/dL
KETONES UR: NEGATIVE mg/dL
NITRITE: NEGATIVE
PH: 6 (ref 5.0–8.0)
Protein, ur: 30 mg/dL — AB
Specific Gravity, Urine: 1.015 (ref 1.005–1.030)

## 2016-02-26 LAB — PROTEIN / CREATININE RATIO, URINE
CREATININE, URINE: 116 mg/dL
PROTEIN CREATININE RATIO: 0.47 mg/mg{creat} — AB (ref 0.00–0.15)
TOTAL PROTEIN, URINE: 55 mg/dL

## 2016-02-26 MED ORDER — ACETAMINOPHEN 500 MG PO TABS
1000.0000 mg | ORAL_TABLET | Freq: Once | ORAL | Status: AC
  Administered 2016-02-26: 1000 mg via ORAL
  Filled 2016-02-26: qty 2

## 2016-02-26 NOTE — MAU Provider Note (Signed)
History     CSN: [id number]  Arrival date and time: 02/26/16 2043   First Provider Initiated Contact with Patient 02/26/16 2125      Chief Complaint  Patient presents with  . Hypertension   HPI   Ms.Lori [name] is a 31 y.o. female G1P0 at [redacted]w[redacted]d di-di twins here in MAU with elevated BP. She has been monitoring her BP at home. At 6:30 PM at home her BP was 157/98 at 7:30 at home it read 148/97, she then went to [facility] to check her BP and it was still elevated so she came here to be evaluated. + fetal movement. She completed the course of betamethasone at 34 weeks.  Denies vaginal bleeding   OB History    Gravida Para Term Preterm AB Living   1             SAB TAB Ectopic Multiple Live Births                  Past Medical History:  Diagnosis Date  . Allergy   . History of shingles    as a child  . Kidney stone   . Kidney stones   . Migraines     Past Surgical History:  Procedure Laterality Date  . CHOLECYSTECTOMY  12/28/2010   Procedure: LAPAROSCOPIC CHOLECYSTECTOMY;  Surgeon: Almond Lint, MD;  Location: [facility];  Service: General;  Laterality: N/A;  . COLONOSCOPY  12/26/2010  . minor skin excision for a mole     . UPPER GASTROINTESTINAL ENDOSCOPY  12/26/2010  . WISDOM TOOTH EXTRACTION      Family History  Problem Relation Age of Onset  . Hypertension Maternal Grandmother   . Diabetes Maternal Grandfather   . Hyperlipidemia Maternal Grandfather   . Heart disease Maternal Grandfather   . Hypertension Maternal Grandfather   . Stroke Maternal Grandfather     Social History  Substance Use Topics  . Smoking status: Former Smoker    Types: Cigarettes  . Smokeless tobacco: Former Neurosurgeon    Quit date: 12/26/2008  . Alcohol use No    Allergies:  Allergies  Allergen Reactions  . Bee Venom Anaphylaxis  . 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    . Amoxicillin-Pot Clavulanate Diarrhea and Other (See Comments)    Has patient had a  PCN reaction causing immediate rash, facial/tongue/throat swelling, SOB or lightheadedness with hypotension: No Has patient had a PCN reaction causing severe rash involving mucus membranes or skin necrosis: No Has patient had a PCN reaction that required hospitalization No Has patient had a PCN reaction occurring within the last 10 years: No If all of the above answers are "NO", then may proceed with Cephalosporin use.  Marland Kitchen Morphine And Related Other (See Comments)    Reaction:  Caused pt to uncontrollably shake   . Penicillins Rash and Other (See Comments)    Has patient had a PCN reaction causing immediate rash, facial/tongue/throat swelling, SOB or lightheadedness with hypotension: No Has patient had a PCN reaction causing severe rash involving mucus membranes or skin necrosis: No Has patient had a PCN reaction that required hospitalization No Has patient had a PCN reaction occurring within the last 10 years: No If all of the above answers are "NO", then may proceed with Cephalosporin use.    Prescriptions Prior to Admission  Medication Sig Dispense Refill Last Dose  . acetaminophen (TYLENOL) 500 MG tablet Take 1,000 mg by mouth  every 6 (six) hours as needed for mild pain, moderate pain or headache.   Past Week at Unknown time  . calcium carbonate (TUMS - DOSED IN MG ELEMENTAL CALCIUM) 500 MG chewable tablet Chew 1 tablet by mouth every 4 (four) hours as needed for indigestion or heartburn.   02/24/2016 at Unknown time  . docusate sodium (COLACE) 100 MG capsule Take 200 mg by mouth daily.   02/23/2016 at Unknown time  . EPINEPHrine (EPIPEN 2-PAK) 0.3 mg/0.3 mL IJ SOAJ injection Inject 0.3 mg into the muscle once as needed (for severe allergic reaction).   Emergency  . ferrous sulfate 325 (65 FE) MG tablet Take 1 tablet (325 mg total) by mouth daily with breakfast. 90 tablet 3   . hydrocortisone-pramoxine (PROCTOFOAM HC) rectal foam Place 1 applicator rectally 2 (two) times daily. (Patient taking  differently: Place 1 applicator rectally 3 (three) times daily. ) 10 g 0 02/24/2016 at Unknown time  . levocetirizine (XYZAL) 5 MG tablet Take 5 mg by mouth daily.   02/23/2016 at Unknown time  . loratadine (CLARITIN) 10 MG tablet Take 10 mg by mouth daily.    Past Week at Unknown time  . Prenatal Vit-Fe Fumarate-FA (PRENATAL MULTIVITAMIN) TABS tablet Take 1 tablet by mouth daily at 12 noon.   02/24/2016 at Unknown time    Results for orders placed or performed during the hospital encounter of 02/26/16 (from the past 48 hour(s))  Urinalysis, Routine w reflex microscopic     Status: Abnormal   Collection Time: 02/26/16  9:00 PM  Result Value Ref Range   Color, Urine YELLOW YELLOW   APPearance HAZY (A) CLEAR   Specific Gravity, Urine 1.015 1.005 - 1.030   pH 6.0 5.0 - 8.0   Glucose, UA NEGATIVE NEGATIVE mg/dL   Hgb urine dipstick SMALL (A) NEGATIVE   Bilirubin Urine NEGATIVE NEGATIVE   Ketones, ur NEGATIVE NEGATIVE mg/dL   Protein, ur 30 (A) NEGATIVE mg/dL   Nitrite NEGATIVE NEGATIVE   Leukocytes, UA TRACE (A) NEGATIVE   RBC / HPF 6-30 0 - 5 RBC/hpf   WBC, UA 6-30 0 - 5 WBC/hpf   Bacteria, UA RARE (A) NONE SEEN   Squamous Epithelial / LPF 6-30 (A) NONE SEEN   Mucous PRESENT   Protein / creatinine ratio, urine     Status: Abnormal   Collection Time: 02/26/16  9:00 PM  Result Value Ref Range   Creatinine, Urine 116.00 mg/dL   Total Protein, Urine 55 mg/dL    Comment: NO NORMAL RANGE ESTABLISHED FOR THIS TEST   Protein Creatinine Ratio 0.47 (H) 0.00 - 0.15 mg/mg[Cre]  CBC     Status: Abnormal   Collection Time: 02/26/16  9:40 PM  Result Value Ref Range   WBC 12.9 (H) 4.0 - 10.5 K/uL   RBC 3.90 3.87 - 5.11 MIL/uL   Hemoglobin 9.2 (L) 12.0 - 15.0 g/dL   HCT 29.0 (L) 36.0 - 46.0 %   MCV 74.4 (L) 78.0 - 100.0 fL   MCH 23.6 (L) 26.0 - 34.0 pg   MCHC 31.7 30.0 - 36.0 g/dL   RDW 16.4 (H) 11.5 - 15.5 %   Platelets 375 150 - 400 K/uL  Comprehensive metabolic panel     Status: Abnormal    Collection Time: 02/26/16  9:40 PM  Result Value Ref Range   Sodium 136 135 - 145 mmol/L   Potassium 3.8 3.5 - 5.1 mmol/L   Chloride 105 101 - 111 mmol/L   CO2 23  22 - 32 mmol/L   Glucose, Bld 103 (H) 65 - 99 mg/dL   BUN 12 6 - 20 mg/dL   Creatinine, Ser 0.59 0.44 - 1.00 mg/dL   Calcium 9.0 8.9 - 10.3 mg/dL   Total Protein 5.8 (L) 6.5 - 8.1 g/dL   Albumin 2.6 (L) 3.5 - 5.0 g/dL   AST 21 15 - 41 U/L   ALT 14 14 - 54 U/L   Alkaline Phosphatase 131 (H) 38 - 126 U/L   Total Bilirubin 0.4 0.3 - 1.2 mg/dL   GFR calc non Af Amer >60 >60 mL/min   GFR calc Af Amer >60 >60 mL/min    Comment: (NOTE) The eGFR has been calculated using the CKD EPI equation. This calculation has not been validated in all clinical situations. eGFR's persistently <60 mL/min signify possible Chronic Kidney Disease.    Anion gap 8 5 - 15    Review of Systems  Eyes: Positive for visual disturbance (History of occular migraines ).  Gastrointestinal: Negative for abdominal pain.  Neurological: Positive for headaches.   Physical Exam   Blood pressure 147/92, pulse 92, temperature 97.5 F (36.4 C), resp. rate 20, height '5\' 4"'$  (1.626 m), weight 234 lb 1.3 oz (106.2 kg), last menstrual period 06/21/2015.   Patient Vitals for the past 24 hrs:  BP Temp Pulse Resp Height Weight  02/26/16 2232 106/66 - 92 - - -  02/26/16 2217 116/65 - 91 - - -  02/26/16 2203 123/64 - 81 - - -  02/26/16 2147 133/79 - 92 - - -  02/26/16 2131 136/87 - 97 - - -  02/26/16 2117 121/77 - 102 - - -  02/26/16 2114 126/71 - 105 - - -  02/26/16 2054 147/92 97.5 F (36.4 C) 92 20 '5\' 4"'$  (1.626 m) 234 lb 1.3 oz (106.2 kg)    Physical Exam  Constitutional: She is oriented to person, place, and time. She appears well-developed and well-nourished. No distress.  Respiratory: Effort normal.  GI: Soft. She exhibits no distension. There is no tenderness. There is no rebound.  Musculoskeletal: Normal range of motion.  Neurological: She is alert  and oriented to person, place, and time. She has normal reflexes. She displays normal reflexes.  Negative Clonus   Skin: Skin is warm. She is not diaphoretic.  Psychiatric: Her behavior is normal.    Fetal Tracing A: Baseline: 135 bpm  Variability: Moderate  Accelerations: 15x15 Decelerations: None  Fetal Tracing B: Baseline: 145 bpm  Variability: Moderate  Accelerations: 15x15 Decelerations: None Toco: UI  MAU Course  Procedures  None  MDM  -PIH labs  -Tylenol 1 gram PO -Patient rates her pain 5/10 prior to tylenol. Patient feeling much better at 2300; throbbing in her head gone. Patient rates her pain 1/10 "I feel much better". Discussed with Dr. Rogue Bussing. Visual disturbances gone.   Assessment and Plan   A:  1. Preeclampsia, third trimester     P:  Discharge home in stable condition Patient is planning to start 24 hour urine per the office. Patient to return to the office on Monday.  Return to MAU if symptoms worsen Preeclampsia precautions Strict return precautions    Lezlie Lye, NP 02/26/2016 11:22 PM

## 2016-02-26 NOTE — MAU Note (Signed)
Was here Thurs for htn eval. Was told to keep track of B/Ps and they have been high since 1600. I have a headache but have hx migraines. Have a spot in visual field but get ocular migraines. I can see words but cannot read individually. Vomited tonight and still have nausea. No epigastric pain

## 2016-02-26 NOTE — Discharge Instructions (Signed)

## 2016-02-28 ENCOUNTER — Telehealth (HOSPITAL_COMMUNITY): Payer: Self-pay | Admitting: *Deleted

## 2016-02-28 NOTE — Telephone Encounter (Signed)
Preadmission screen  

## 2016-02-29 ENCOUNTER — Encounter (HOSPITAL_COMMUNITY): Payer: Self-pay

## 2016-03-07 ENCOUNTER — Other Ambulatory Visit (HOSPITAL_COMMUNITY): Payer: 59

## 2016-03-07 ENCOUNTER — Encounter (HOSPITAL_COMMUNITY)
Admission: RE | Admit: 2016-03-07 | Discharge: 2016-03-07 | Disposition: A | Discharge status: Home / Self Care | Payer: 59 | Source: Ambulatory Visit | Attending: Obstetrics and Gynecology | Admitting: Obstetrics and Gynecology

## 2016-03-07 ENCOUNTER — Encounter (INDEPENDENT_AMBULATORY_CARE_PROVIDER_SITE_OTHER): Payer: Self-pay

## 2016-03-07 LAB — COMPREHENSIVE METABOLIC PANEL
ALBUMIN: 2.7 g/dL — AB (ref 3.5–5.0)
ALT: 14 U/L (ref 14–54)
AST: 23 U/L (ref 15–41)
Alkaline Phosphatase: 149 U/L — ABNORMAL HIGH (ref 38–126)
Anion gap: 8 (ref 5–15)
BUN: 11 mg/dL (ref 6–20)
CHLORIDE: 102 mmol/L (ref 101–111)
CO2: 23 mmol/L (ref 22–32)
Calcium: 9.2 mg/dL (ref 8.9–10.3)
Creatinine, Ser: 0.71 mg/dL (ref 0.44–1.00)
GFR calc Af Amer: >60 mL/min (ref >60)
GLUCOSE: 79 mg/dL (ref 65–99)
POTASSIUM: 3.8 mmol/L (ref 3.5–5.1)
SODIUM: 133 mmol/L — AB (ref 135–145)
Total Bilirubin: 0.4 mg/dL (ref 0.3–1.2)
Total Protein: 6.2 g/dL — ABNORMAL LOW (ref 6.5–8.1)

## 2016-03-07 LAB — CBC
HCT: 28.7 % — ABNORMAL LOW (ref 36.0–46.0)
Hemoglobin: 9.2 g/dL — ABNORMAL LOW (ref 12.0–15.0)
MCH: 23.2 pg — ABNORMAL LOW (ref 26.0–34.0)
MCHC: 32.1 g/dL (ref 30.0–36.0)
MCV: 72.3 fL — ABNORMAL LOW (ref 78.0–100.0)
PLATELETS: 388 10*3/uL (ref 150–400)
RBC: 3.97 MIL/uL (ref 3.87–5.11)
RDW: 16.8 % — AB (ref 11.5–15.5)
WBC: 12.4 10*3/uL — AB (ref 4.0–10.5)

## 2016-03-07 NOTE — Patient Instructions (Signed)
20 Eartha InchHeather N Kurihara  03/07/2016   Your procedure is scheduled on:  03/08/2016  [facility] 0830 AM.  Pick up the phone at the desk and dial [phone] .   Call this number if you have problems the morning of surgery: [phone]   Remember:   Do not eat food:After Midnight.  Do not drink clear liquids: After Midnight.  Take these medicines the morning of surgery with A SIP OF WATER: none   Do not wear jewelry, make-up or nail polish.  Do not wear lotions, powders, or perfumes. Do not wear deodorant.  Do not shave 48 hours prior to surgery.  Do not bring valuables to the hospital.  [facility] is not   responsible for any belongings or valuables brought to the hospital.  Contacts, dentures or bridgework may not be worn into surgery.  Leave suitcase in the car. After surgery it may be brought to your room.  For patients admitted to the hospital, checkout time is 11:00 AM the day of              discharge.   Patients discharged the day of surgery will not be allowed to drive             home.  Name and phone number of your driver: na  Special Instructions:   N/A   Please read over the following fact sheets that you were given:   Surgical Site Infection Prevention

## 2016-03-07 NOTE — H&P (Signed)
31 y.o.  G1P0 [id number] comes in for [name] cesarean section at term for twins with B non vertex presentation.  Pt has been counseled about all risks of both  Elective C/S and labor with non vertex B and desires the primary C/S.  Patient has good fetal movement and no bleeding.  She also has mild preeclampsia and is being delivered at 37 weeks.  Past Medical History:  Diagnosis Date  . Allergy   . History of shingles    as [name] child  . Kidney stone   . Kidney stones   . Migraines     Past Surgical History:  Procedure Laterality Date  . CHOLECYSTECTOMY  12/28/2010   Procedure: LAPAROSCOPIC CHOLECYSTECTOMY;  Surgeon: Almond LintFaera Byerly, MD;  Location: [facility];  Service: General;  Laterality: N/[name];  . COLONOSCOPY  12/26/2010  . minor skin excision for [name] mole     . UPPER GASTROINTESTINAL ENDOSCOPY  12/26/2010  . WISDOM TOOTH EXTRACTION      OB History  Gravida Para Term Preterm AB Living  1            SAB TAB Ectopic Multiple Live Births               # Outcome Date GA Lbr Len/2nd Weight Sex Delivery Anes PTL Lv  1 Current               Social History   Social History  . Marital status: Married    Spouse name: N/[name]  . Number of children: N/[name]  . Years of education: N/[name]   Occupational History  . Not on file.   Social History Main Topics  . Smoking status: Former Smoker    Types: Cigarettes  . Smokeless tobacco: Former NeurosurgeonUser    Quit date: 12/26/2008  . Alcohol use No  . Drug use: No  . Sexual activity: Yes   Other Topics Concern  . Not on file   Social History Narrative  . No narrative on file   Bee venom; Food; Amoxicillin-pot clavulanate; Morphine and related; and Penicillins   Prenatal Course: uncomplicated except for mild preeclampsia.   Prenatal Transfer Tool  Maternal Diabetes: No Genetic Screening: Declined Maternal Ultrasounds/Referrals: Normal Fetal Ultrasounds or other Referrals:  None Maternal Substance Abuse:  No Significant Maternal Medications:  None Significant  Maternal Lab Results: None  There were no vitals filed for this visit.  Lungs/Cor:  NAD Abdomen:  soft, gravid Ex:  no cords, erythema SVE:  NA FHTs:  Present for both.  [name]/P  For cesarean sectionat term.  All risks, benefits and alternatives discussed with patient and she desires to proceed.  Lori [name]

## 2016-03-08 ENCOUNTER — Inpatient Hospital Stay (HOSPITAL_COMMUNITY)
Admission: RE | Admit: 2016-03-08 | Discharge: 2016-03-11 | DRG: 765 | Disposition: A | Discharge status: Home / Self Care | Payer: 59 | Source: Ambulatory Visit | Attending: Obstetrics and Gynecology | Admitting: Obstetrics and Gynecology

## 2016-03-08 ENCOUNTER — Inpatient Hospital Stay (HOSPITAL_COMMUNITY): Payer: 59 | Admitting: Anesthesiology

## 2016-03-08 ENCOUNTER — Encounter (HOSPITAL_COMMUNITY): Payer: Self-pay | Admitting: *Deleted

## 2016-03-08 ENCOUNTER — Encounter (HOSPITAL_COMMUNITY)
Admission: RE | Disposition: A | Discharge status: Home / Self Care | Payer: Self-pay | Source: Ambulatory Visit | Attending: Obstetrics and Gynecology

## 2016-03-08 DIAGNOSIS — Z87891 Personal history of nicotine dependence: Secondary | ICD-10-CM | POA: Diagnosis not present

## 2016-03-08 DIAGNOSIS — D62 Acute posthemorrhagic anemia: Secondary | ICD-10-CM | POA: Diagnosis not present

## 2016-03-08 DIAGNOSIS — D509 Iron deficiency anemia, unspecified: Secondary | ICD-10-CM | POA: Diagnosis present

## 2016-03-08 DIAGNOSIS — O30043 Twin pregnancy, dichorionic/diamniotic, third trimester: Secondary | ICD-10-CM | POA: Diagnosis present

## 2016-03-08 DIAGNOSIS — O283 Abnormal ultrasonic finding on antenatal screening of mother: Secondary | ICD-10-CM

## 2016-03-08 DIAGNOSIS — Z3A37 37 weeks gestation of pregnancy: Secondary | ICD-10-CM

## 2016-03-08 DIAGNOSIS — Z6839 Body mass index (BMI) 39.0-39.9, adult: Secondary | ICD-10-CM

## 2016-03-08 DIAGNOSIS — Z9889 Other specified postprocedural states: Secondary | ICD-10-CM

## 2016-03-08 DIAGNOSIS — O99214 Obesity complicating childbirth: Secondary | ICD-10-CM | POA: Diagnosis present

## 2016-03-08 DIAGNOSIS — O321XX2 Maternal care for breech presentation, fetus 2: Secondary | ICD-10-CM | POA: Diagnosis present

## 2016-03-08 DIAGNOSIS — O328XX2 Maternal care for other malpresentation of fetus, fetus 2: Principal | ICD-10-CM | POA: Diagnosis present

## 2016-03-08 DIAGNOSIS — O9081 Anemia of the puerperium: Secondary | ICD-10-CM | POA: Diagnosis not present

## 2016-03-08 DIAGNOSIS — O1404 Mild to moderate pre-eclampsia, complicating childbirth: Secondary | ICD-10-CM | POA: Diagnosis present

## 2016-03-08 LAB — PREPARE RBC (CROSSMATCH)

## 2016-03-08 LAB — RPR: RPR Ser Ql: NONREACTIVE

## 2016-03-08 SURGERY — Surgical Case
Anesthesia: Spinal | Site: Abdomen | Wound class: Clean Contaminated

## 2016-03-08 MED ORDER — LACTATED RINGERS IV SOLN
INTRAVENOUS | Status: DC
  Administered 2016-03-08: 19:00:00 via INTRAVENOUS

## 2016-03-08 MED ORDER — SIMETHICONE 80 MG PO CHEW
80.0000 mg | CHEWABLE_TABLET | ORAL | Status: DC | PRN
Start: 2016-03-08 — End: 2016-03-11
  Administered 2016-03-10 – 2016-03-11 (×2): 80 mg via ORAL
  Filled 2016-03-08 (×2): qty 1

## 2016-03-08 MED ORDER — METHYLERGONOVINE MALEATE 0.2 MG PO TABS: 0.2000 mg | ORAL_TABLET | ORAL | Status: DC | PRN

## 2016-03-08 MED ORDER — ONDANSETRON HCL 4 MG/2ML IJ SOLN
INTRAMUSCULAR | Status: AC
  Filled 2016-03-08: qty 2

## 2016-03-08 MED ORDER — IBUPROFEN 600 MG PO TABS
600.0000 mg | ORAL_TABLET | Freq: Four times a day (QID) | ORAL | Status: DC
  Administered 2016-03-08 – 2016-03-11 (×11): 600 mg via ORAL
  Filled 2016-03-08 (×11): qty 1

## 2016-03-08 MED ORDER — LORATADINE 10 MG PO TABS
10.0000 mg | ORAL_TABLET | Freq: Every day | ORAL | Status: DC
  Administered 2016-03-09: 10 mg via ORAL
  Filled 2016-03-08 (×2): qty 1

## 2016-03-08 MED ORDER — LACTATED RINGERS IV SOLN
INTRAVENOUS | Status: DC | PRN
  Administered 2016-03-08 (×3): via INTRAVENOUS

## 2016-03-08 MED ORDER — ACETAMINOPHEN 325 MG PO TABS
650.0000 mg | ORAL_TABLET | ORAL | Status: DC | PRN
  Administered 2016-03-08 – 2016-03-11 (×6): 650 mg via ORAL
  Filled 2016-03-08 (×6): qty 2

## 2016-03-08 MED ORDER — GENTAMICIN SULFATE 40 MG/ML IJ SOLN
Freq: Once | INTRAVENOUS | Status: AC
  Administered 2016-03-08: 100 mL via INTRAVENOUS
  Filled 2016-03-08: qty 9.25

## 2016-03-08 MED ORDER — FERROUS SULFATE 325 (65 FE) MG PO TABS
325.0000 mg | ORAL_TABLET | Freq: Every day | ORAL | Status: DC
  Administered 2016-03-11: 325 mg via ORAL
  Filled 2016-03-08 (×2): qty 1

## 2016-03-08 MED ORDER — OXYCODONE HCL 5 MG PO TABS
10.0000 mg | ORAL_TABLET | ORAL | Status: DC | PRN
  Administered 2016-03-09 – 2016-03-10 (×2): 10 mg via ORAL
  Filled 2016-03-08 (×3): qty 2

## 2016-03-08 MED ORDER — PRENATAL MULTIVITAMIN CH
1.0000 | ORAL_TABLET | Freq: Every day | ORAL | Status: DC
  Administered 2016-03-09: 1 via ORAL
  Filled 2016-03-08 (×2): qty 1

## 2016-03-08 MED ORDER — OXYTOCIN 40 UNITS IN LACTATED RINGERS INFUSION - SIMPLE MED
INTRAVENOUS | Status: DC | PRN
  Administered 2016-03-08: 40 [IU] via INTRAVENOUS

## 2016-03-08 MED ORDER — HYDROCORTISONE ACE-PRAMOXINE 1-1 % RE FOAM
1.0000 | Freq: Two times a day (BID) | RECTAL | Status: DC
  Administered 2016-03-09 – 2016-03-11 (×3): 1 via RECTAL
  Filled 2016-03-08 (×2): qty 10

## 2016-03-08 MED ORDER — FLEET ENEMA 7-19 GM/118ML RE ENEM: 1.0000 | ENEMA | Freq: Every day | RECTAL | Status: DC | PRN

## 2016-03-08 MED ORDER — MEASLES, MUMPS & RUBELLA VAC ~~LOC~~ INJ: 0.5000 mL | INJECTION | Freq: Once | SUBCUTANEOUS | Status: DC

## 2016-03-08 MED ORDER — LEVOCETIRIZINE DIHYDROCHLORIDE 5 MG PO TABS
5.0000 mg | ORAL_TABLET | Freq: Every day | ORAL | Status: DC
Start: 2016-03-08 — End: 2016-03-08

## 2016-03-08 MED ORDER — MEPERIDINE HCL 25 MG/ML IJ SOLN: 6.2500 mg | INTRAMUSCULAR | Status: DC | PRN

## 2016-03-08 MED ORDER — EPINEPHRINE 0.3 MG/0.3ML IJ SOAJ
0.3000 mg | Freq: Once | INTRAMUSCULAR | Status: DC | PRN
  Filled 2016-03-08: qty 0.3

## 2016-03-08 MED ORDER — METOCLOPRAMIDE HCL 5 MG/ML IJ SOLN
INTRAMUSCULAR | Status: DC | PRN
  Administered 2016-03-08: 10 mg via INTRAVENOUS

## 2016-03-08 MED ORDER — METOCLOPRAMIDE HCL 5 MG/ML IJ SOLN
INTRAMUSCULAR | Status: AC
  Filled 2016-03-08: qty 2

## 2016-03-08 MED ORDER — SIMETHICONE 80 MG PO CHEW
80.0000 mg | CHEWABLE_TABLET | Freq: Three times a day (TID) | ORAL | Status: DC
  Administered 2016-03-08 – 2016-03-11 (×7): 80 mg via ORAL
  Filled 2016-03-08 (×7): qty 1

## 2016-03-08 MED ORDER — OXYTOCIN 10 UNIT/ML IJ SOLN
INTRAMUSCULAR | Status: AC
  Filled 2016-03-08: qty 4

## 2016-03-08 MED ORDER — DIPHENHYDRAMINE HCL 25 MG PO CAPS
25.0000 mg | ORAL_CAPSULE | ORAL | Status: DC | PRN
  Filled 2016-03-08: qty 1

## 2016-03-08 MED ORDER — LACTATED RINGERS IV SOLN
INTRAVENOUS | Status: DC
  Administered 2016-03-08: 09:00:00 via INTRAVENOUS

## 2016-03-08 MED ORDER — PHENYLEPHRINE 40 MCG/ML (10ML) SYRINGE FOR IV PUSH (FOR BLOOD PRESSURE SUPPORT)
PREFILLED_SYRINGE | INTRAVENOUS | Status: AC
  Filled 2016-03-08: qty 10

## 2016-03-08 MED ORDER — SIMETHICONE 80 MG PO CHEW
80.0000 mg | CHEWABLE_TABLET | ORAL | Status: DC
  Administered 2016-03-09 – 2016-03-10 (×2): 80 mg via ORAL
  Filled 2016-03-08 (×2): qty 1

## 2016-03-08 MED ORDER — OXYTOCIN 40 UNITS IN LACTATED RINGERS INFUSION - SIMPLE MED: 2.5000 [IU]/h | INTRAVENOUS | Status: AC

## 2016-03-08 MED ORDER — FENTANYL CITRATE (PF) 100 MCG/2ML IJ SOLN: 25.0000 ug | INTRAMUSCULAR | Status: DC | PRN

## 2016-03-08 MED ORDER — NALBUPHINE HCL 10 MG/ML IJ SOLN: 5.0000 mg | Freq: Once | INTRAMUSCULAR | Status: DC | PRN

## 2016-03-08 MED ORDER — BISACODYL 10 MG RE SUPP: 10.0000 mg | Freq: Every day | RECTAL | Status: DC | PRN

## 2016-03-08 MED ORDER — DIPHENHYDRAMINE HCL 50 MG/ML IJ SOLN
INTRAMUSCULAR | Status: DC | PRN
  Administered 2016-03-08: 12.5 mg via INTRAVENOUS

## 2016-03-08 MED ORDER — PHENYLEPHRINE 8 MG IN D5W 100 ML (0.08MG/ML) PREMIX OPTIME
INJECTION | INTRAVENOUS | Status: DC | PRN
  Administered 2016-03-08: 60 ug/min via INTRAVENOUS

## 2016-03-08 MED ORDER — NALOXONE HCL 2 MG/2ML IJ SOSY
1.0000 ug/kg/h | PREFILLED_SYRINGE | INTRAVENOUS | Status: DC | PRN
  Filled 2016-03-08: qty 2

## 2016-03-08 MED ORDER — SENNOSIDES-DOCUSATE SODIUM 8.6-50 MG PO TABS
2.0000 | ORAL_TABLET | ORAL | Status: DC
  Administered 2016-03-09 – 2016-03-10 (×3): 2 via ORAL
  Filled 2016-03-08 (×4): qty 2

## 2016-03-08 MED ORDER — WITCH HAZEL-GLYCERIN EX PADS: 1.0000 "application " | MEDICATED_PAD | CUTANEOUS | Status: DC | PRN

## 2016-03-08 MED ORDER — ONDANSETRON HCL 4 MG/2ML IJ SOLN: INTRAMUSCULAR | Status: DC | PRN

## 2016-03-08 MED ORDER — DIBUCAINE 1 % RE OINT: 1.0000 "application " | TOPICAL_OINTMENT | RECTAL | Status: DC | PRN

## 2016-03-08 MED ORDER — LACTATED RINGERS IV SOLN
INTRAVENOUS | Status: DC | PRN
  Administered 2016-03-08: 10:00:00 via INTRAVENOUS

## 2016-03-08 MED ORDER — NALBUPHINE HCL 10 MG/ML IJ SOLN: 5.0000 mg | INTRAMUSCULAR | Status: DC | PRN

## 2016-03-08 MED ORDER — TETANUS-DIPHTH-ACELL PERTUSSIS 5-2.5-18.5 LF-MCG/0.5 IM SUSP: 0.5000 mL | Freq: Once | INTRAMUSCULAR | Status: DC

## 2016-03-08 MED ORDER — SODIUM CHLORIDE 0.9 % IR SOLN
Status: DC | PRN
  Administered 2016-03-08: 1000 mL

## 2016-03-08 MED ORDER — ZOLPIDEM TARTRATE 5 MG PO TABS: 5.0000 mg | ORAL_TABLET | Freq: Every evening | ORAL | Status: DC | PRN

## 2016-03-08 MED ORDER — COCONUT OIL OIL: 1.0000 "application " | TOPICAL_OIL | Status: DC | PRN

## 2016-03-08 MED ORDER — MENTHOL 3 MG MT LOZG: 1.0000 | LOZENGE | OROMUCOSAL | Status: DC | PRN

## 2016-03-08 MED ORDER — METHYLERGONOVINE MALEATE 0.2 MG/ML IJ SOLN: 0.2000 mg | INTRAMUSCULAR | Status: DC | PRN

## 2016-03-08 MED ORDER — PHENYLEPHRINE HCL 10 MG/ML IJ SOLN
INTRAMUSCULAR | Status: DC | PRN
  Administered 2016-03-08: 40 ug via INTRAVENOUS
  Administered 2016-03-08: 80 ug via INTRAVENOUS
  Administered 2016-03-08: 120 ug via INTRAVENOUS
  Administered 2016-03-08: 80 ug via INTRAVENOUS
  Administered 2016-03-08 (×2): 40 ug via INTRAVENOUS

## 2016-03-08 MED ORDER — PHENYLEPHRINE 8 MG IN D5W 100 ML (0.08MG/ML) PREMIX OPTIME
INJECTION | INTRAVENOUS | Status: AC
  Filled 2016-03-08: qty 100

## 2016-03-08 MED ORDER — ONDANSETRON HCL 4 MG/2ML IJ SOLN
INTRAMUSCULAR | Status: DC | PRN
  Administered 2016-03-08: 4 mg via INTRAVENOUS

## 2016-03-08 MED ORDER — FENTANYL CITRATE (PF) 100 MCG/2ML IJ SOLN
INTRAMUSCULAR | Status: AC
  Filled 2016-03-08: qty 2

## 2016-03-08 MED ORDER — SCOPOLAMINE 1 MG/3DAYS TD PT72
1.0000 | MEDICATED_PATCH | Freq: Once | TRANSDERMAL | Status: AC
  Administered 2016-03-08: 1.5 mg via TRANSDERMAL
  Filled 2016-03-08: qty 1

## 2016-03-08 MED ORDER — DIPHENHYDRAMINE HCL 50 MG/ML IJ SOLN
INTRAMUSCULAR | Status: AC
  Filled 2016-03-08: qty 1

## 2016-03-08 MED ORDER — DIPHENHYDRAMINE HCL 25 MG PO CAPS: 25.0000 mg | ORAL_CAPSULE | Freq: Four times a day (QID) | ORAL | Status: DC | PRN

## 2016-03-08 MED ORDER — DEXAMETHASONE SODIUM PHOSPHATE 4 MG/ML IJ SOLN
INTRAMUSCULAR | Status: DC | PRN
  Administered 2016-03-08: 4 mg via INTRAVENOUS

## 2016-03-08 MED ORDER — MORPHINE SULFATE (PF) 0.5 MG/ML IJ SOLN
INTRAMUSCULAR | Status: AC
  Filled 2016-03-08: qty 10

## 2016-03-08 MED ORDER — NALOXONE HCL 0.4 MG/ML IJ SOLN: 0.4000 mg | INTRAMUSCULAR | Status: DC | PRN

## 2016-03-08 MED ORDER — SODIUM CHLORIDE 0.9% FLUSH
3.0000 mL | INTRAVENOUS | Status: DC | PRN
Start: 2016-03-08 — End: 2016-03-08

## 2016-03-08 MED ORDER — ACETAMINOPHEN 500 MG PO TABS: 1000.0000 mg | ORAL_TABLET | Freq: Four times a day (QID) | ORAL | Status: DC

## 2016-03-08 MED ORDER — ONDANSETRON HCL 4 MG/2ML IJ SOLN: 4.0000 mg | Freq: Three times a day (TID) | INTRAMUSCULAR | Status: DC | PRN

## 2016-03-08 MED ORDER — OXYCODONE HCL 5 MG PO TABS
5.0000 mg | ORAL_TABLET | ORAL | Status: DC | PRN
  Administered 2016-03-09 – 2016-03-10 (×2): 5 mg via ORAL
  Filled 2016-03-08 (×2): qty 1

## 2016-03-08 MED ORDER — DIPHENHYDRAMINE HCL 50 MG/ML IJ SOLN: 12.5000 mg | INTRAMUSCULAR | Status: DC | PRN

## 2016-03-08 SURGICAL SUPPLY — 42 items
APL SKNCLS STERI-STRIP NONHPOA (GAUZE/BANDAGES/DRESSINGS) ×1
BENZOIN TINCTURE PRP APPL 2/3 (GAUZE/BANDAGES/DRESSINGS) ×3 IMPLANT
CHLORAPREP W/TINT 26ML (MISCELLANEOUS) ×3 IMPLANT
CLAMP CORD UMBIL (MISCELLANEOUS) IMPLANT
CLOSURE STERI STRIP 1/2 X4 (GAUZE/BANDAGES/DRESSINGS) ×2 IMPLANT
CLOTH BEACON ORANGE TIMEOUT ST (SAFETY) ×3 IMPLANT
DRSG OPSITE POSTOP 4X10 (GAUZE/BANDAGES/DRESSINGS) ×3 IMPLANT
ELECT REM PT RETURN 9FT ADLT (ELECTROSURGICAL) ×3
ELECTRODE REM PT RTRN 9FT ADLT (ELECTROSURGICAL) ×1 IMPLANT
EXTRACTOR VACUUM BELL STYLE (SUCTIONS) IMPLANT
GLOVE BIO SURGEON STRL SZ7 (GLOVE) ×3 IMPLANT
GLOVE BIOGEL PI IND STRL 7.0 (GLOVE) ×2 IMPLANT
GLOVE BIOGEL PI INDICATOR 7.0 (GLOVE) ×4
GOWN STRL REUS W/TWL LRG LVL3 (GOWN DISPOSABLE) ×6 IMPLANT
KIT ABG SYR 3ML LUER SLIP (SYRINGE) IMPLANT
NDL HYPO 18GX1.5 BLUNT FILL (NEEDLE) ×1 IMPLANT
NDL HYPO 25X5/8 SAFETYGLIDE (NEEDLE) IMPLANT
NDL SAFETY ECLIPSE 18X1.5 (NEEDLE) ×1 IMPLANT
NEEDLE HYPO 18GX1.5 BLUNT FILL (NEEDLE) ×3 IMPLANT
NEEDLE HYPO 18GX1.5 SHARP (NEEDLE) ×3
NEEDLE HYPO 25X5/8 SAFETYGLIDE (NEEDLE) IMPLANT
NS IRRIG 1000ML POUR BTL (IV SOLUTION) ×3 IMPLANT
PACK C SECTION WH (CUSTOM PROCEDURE TRAY) ×3 IMPLANT
PAD OB MATERNITY 4.3X12.25 (PERSONAL CARE ITEMS) ×3 IMPLANT
PENCIL SMOKE EVAC W/HOLSTER (ELECTROSURGICAL) ×3 IMPLANT
RTRCTR C-SECT PINK 25CM LRG (MISCELLANEOUS) ×3 IMPLANT
STRIP CLOSURE SKIN 1/2X4 (GAUZE/BANDAGES/DRESSINGS) ×2 IMPLANT
SUT MNCRL 0 VIOLET CTX 36 (SUTURE) ×2 IMPLANT
SUT MONOCRYL 0 CTX 36 (SUTURE) ×4
SUT PDS AB 0 CTX 60 (SUTURE) IMPLANT
SUT PLAIN 2 0 (SUTURE) ×3
SUT PLAIN 2 0 XLH (SUTURE) ×2 IMPLANT
SUT PLAIN ABS 2-0 CT1 27XMFL (SUTURE) IMPLANT
SUT VIC AB 0 CT1 27 (SUTURE) ×6
SUT VIC AB 0 CT1 27XBRD ANBCTR (SUTURE) ×2 IMPLANT
SUT VIC AB 2-0 CT1 27 (SUTURE) ×3
SUT VIC AB 2-0 CT1 TAPERPNT 27 (SUTURE) ×1 IMPLANT
SUT VIC AB 4-0 KS 27 (SUTURE) ×3 IMPLANT
SYR BULB 3OZ (MISCELLANEOUS) ×3 IMPLANT
SYRINGE 10CC LL (SYRINGE) ×3 IMPLANT
TOWEL OR 17X24 6PK STRL BLUE (TOWEL DISPOSABLE) ×3 IMPLANT
TRAY FOLEY CATH SILVER 14FR (SET/KITS/TRAYS/PACK) ×3 IMPLANT

## 2016-03-08 NOTE — Lactation Note (Signed)
This note was copied from a baby's chart. Lactation Consultation Note  Patient Name: Lori [name] [id number]'s Date: 03/08/2016 Reason for consult: Follow-up assessment Baby at 10 hr of life. Upon entry baby Girl was sleeping. Parents repot that she "ate a little while ago" but Baby Boy has not eaten since noon.  Demonstrated waking techniques. Mom has semi firm breast with short nipples. Baby Boy is doing some tongue thrusting. He has a nice gape and high palate. He does have 4 pin sized white dots in a straight line along the midline of palate. When the nipple is "tea cupped" baby can latch and will stay on for a few minutes but mom is not able to re latch baby by herself. Applied #20 NS and baby was able to maintain longer bursts of sucking and baby was able to latch baby unassisted.  Set up DEBP. Discussed baby behavior, feeding frequency, post pumping, possibly supplementing, baby belly size, voids, wt loss, breast changes, and nipple care. Parents are aware of lactation services and support group.  Mom will bf babies on demand q3hr, post pump, and supplement per volume guidelines. She will use her expressed milk then formula.    Maternal Data    Feeding Feeding Type: Breast Fed Length of feed: 15 min  LATCH Score/Interventions Latch: Repeated attempts needed to sustain latch, nipple held in mouth throughout feeding, stimulation needed to elicit sucking reflex. Intervention(s): Adjust position;Assist with latch;Breast massage;Breast compression  Audible Swallowing: A few with stimulation Intervention(s): Skin to skin;Hand expression Intervention(s): Alternate breast massage  Type of Nipple: Everted at rest and after stimulation  Comfort (Breast/Nipple): Soft / non-tender     Hold (Positioning): Full assist, staff holds infant at breast Intervention(s): Position options;Support Pillows  LATCH Score: 6  Lactation Tools Discussed/Used Pump Review: Setup, frequency, and  cleaning;Milk Storage;Other (comment) (pump settings) Initiated by:: ES Date initiated:: 03/08/16   Consult Status Consult Status: Follow-up Date: 03/09/16 Follow-up type: In-patient    Lori [name] 03/08/2016, 9:12 PM

## 2016-03-08 NOTE — Anesthesia Preprocedure Evaluation (Addendum)
Anesthesia Evaluation  Patient identified by MRN, date of birth, ID band Patient awake    Reviewed: Allergy & Precautions, H&P , Patient's Chart, lab work & pertinent test results  Airway Mallampati: II  TM Distance: >3 FB Neck ROM: full    Dental no notable dental hx.    Pulmonary former smoker,    Pulmonary exam normal breath sounds clear to auscultation       Cardiovascular Exercise Tolerance: Good  Rhythm:regular Rate:Normal     Neuro/Psych    GI/Hepatic   Endo/Other  Morbid obesity  Renal/GU      Musculoskeletal   Abdominal   Peds  Hematology  (+) anemia ,   Anesthesia Other Findings   Reproductive/Obstetrics                            Anesthesia Physical Anesthesia Plan  ASA: III  Anesthesia Plan: Spinal   Post-op Pain Management:    Induction:   Airway Management Planned:   Additional Equipment:   Intra-op Plan:   Post-operative Plan:   Informed Consent: I have reviewed the patients History and Physical, chart, labs and discussed the procedure including the risks, benefits and alternatives for the proposed anesthesia with the patient or authorized representative who has indicated his/her understanding and acceptance.   Dental Advisory Given  Plan Discussed with: CRNA  Anesthesia Plan Comments: (Lab work and procedure  confirmed with CRNA in room; platelets okay. Discussed spinal anesthetic, and patient consents to the procedure:  included risk of possible headache,backache, failed block, allergic reaction, and nerve injury. This patient was asked if she had any questions or concerns before the procedure started.  )        Anesthesia Quick Evaluation

## 2016-03-08 NOTE — Brief Op Note (Signed)
03/08/2016  10:53 AM  PATIENT:  Lori [name]  31 y.o. female  PRE-OPERATIVE DIAGNOSIS:  DI/DI TWINS EDD: 03/26/16, malpresentation of Baby B; mild preeclampsia  POST-OPERATIVE DIAGNOSIS:  same  PROCEDURE:  Procedure(s): CESAREAN SECTION MULTI-GESTATIONAL (N/A)  SURGEON:  Surgeon(s) and Role:    * Carrington ClampMichelle Issis Lindseth, MD - Primary    * Marlow Baarsyanna Clark, MD - Assisting  ANESTHESIA:   spinal  EBL:  Total I/O In: 3000 [I.V.:3000] Out: 700 [Urine:100; Blood:600]  SPECIMEN:  Source of Specimen:  placentas  DISPOSITION OF SPECIMEN:  PATHOLOGY  COUNTS:  YES  DICTATION: .Note written in EPIC  PLAN OF CARE: Admit to inpatient   PATIENT DISPOSITION:  PACU - hemodynamically stable.   Delay start of Pharmacological VTE agent (>24hrs) due to surgical blood loss or risk of bleeding: not applicable   Complications:  none Medications:  Ancef, Pitocin Findings:  Baby A female, Apgars 8,9, weight P.  Baby B female, breech, Apgars 9,9.   Normal tubes, ovaries and uterus seen.  Babies were skin to skin with parents after birth in the OR.  Technique:  After adequate spinal anesthesia was achieved, the patient was prepped and draped in usual sterile fashion.  A foley catheter was used to drain the bladder.  A pfannanstiel incision was made with the scalpel and carried down to the fascia with the bovie cautery. The fascia was incised in the midline with the scalpel and carried in a transverse curvilinear manner bilaterally.  The fascia was reflected superiorly and inferiorly off the rectus muscles and the muscles split in the midline.  A bowel free portion of the peritoneum was entered bluntly and then extended in a superior and inferior manner with good visualization of the bowel and bladder.  The Alexis instrument was then placed and the vesico-uterine fascia tented up and incised in a transverse curvilinear manner.  A 2 cm transverse incision was made in the upper portion of the lower uterine  segment until the amnion was exposed.  The incision was extended transversely in a blunt manner.  Clear fluid was noted and Baby A delivered in the vertex presentation without complication.  The baby was bulb suctioned and the cord was clamped and cut.  The baby was then handed to awaiting Neonatology.  Baby B was delivered footling breech without complication, bulb suctioned, cord clamped and cut and the baby handed to the other Neonatologist.  The placenta was then delivered manually and the uterus cleared of all debris.  The uterine incision was then closed with a running lock stitch of 0 monocryl.  An imbricating layer of 0 monocryl was closed as well. Excellent hemostasis of the uterine incision was achieved and the abdomen was cleared with irrigation.  The peritoneum was closed with a running stitch of 2-0 vicryl.  This incorporated the rectus muscles as a separate layer.  The fascia was then closed with a running stitch of 0 vicryl.  The subcutaneous layer was closed with interrupted  stitches of 2-0 plain gut.  The skin was closed with 4-0 vicryl on a Keith needle and steri-strips.  The patient tolerated the procedure well and was returned to the recovery room in stable condition.  All counts were correct times three.  [name]

## 2016-03-08 NOTE — Anesthesia Procedure Notes (Signed)
Procedures

## 2016-03-08 NOTE — Anesthesia Procedure Notes (Signed)

## 2016-03-08 NOTE — Transfer of Care (Signed)
Immediate Anesthesia Transfer of Care Note  Patient: Lori [name]  Procedure(s) Performed: Procedure(s): CESAREAN SECTION MULTI-GESTATIONAL (N/A)  Patient Location: PACU  Anesthesia Type:Spinal  Level of Consciousness: awake, alert  and oriented  Airway & Oxygen Therapy: Patient Spontanous Breathing  Post-op Assessment: Report given to RN and Post -op Vital signs reviewed and stable  Post vital signs: Reviewed and stable, continuing to wean of  phenylephrine  Infusion..  Last Vitals:  Vitals:   03/08/16 0843  BP: 126/87  Pulse: (!) 105  Resp: 20    Last Pain:  Vitals:126/83               93               20   03/08/16 0839  PainSc: 0-No pain      Patients Stated Pain Goal: 4 (03/08/16 0839)  Complications: No apparent anesthesia complications

## 2016-03-08 NOTE — Progress Notes (Signed)
There has been no change in the patients history, status or exam since the history and physical.  Vitals:   03/08/16 0839 03/08/16 0843  BP:  126/87  Pulse:  (!) 105  Resp:  20  Weight: 104.3 kg (230 lb)   Height: 5\' 4"  (1.626 m)     Lab Results  Component Value Date   WBC 12.4 (H) 03/07/2016   HGB 9.2 (L) 03/07/2016   HCT 28.7 (L) 03/07/2016   MCV 72.3 (L) 03/07/2016   PLT 388 03/07/2016    Brelyn Woehl A

## 2016-03-08 NOTE — Lactation Note (Addendum)
This note was copied from a baby's chart. Lactation Consultation Note  Patient Name: Loma SenderBoyB Merryn Pavelko IONGE'XToday's Date: 03/08/2016 Reason for consult: Initial assessment;Other (Comment);Multiple gestation (Early Term Infant )   Initial consult with first time mom of early Term Twins in PACU. Mom reports she plans to try BF and if it works that is ok and if she needs to use formula that is ok. She reports she does not want to get stressed out about BF.  Enc mom to feed infants STS 8-12 x in 24 hours at first feeding cues. Introduced LPT infant policy since infants are 537 w 2 d Early Term Infants. Mom asked amounts of feedings, informed parents that will be given a sheet that has amounts based on day of age. Mom asked what to do if infant will not latch and we discussed spoon feeding and pumping/hand expressing to obtain colostrum for infants. Showed mom how to hand express. Mom with large compressible breast with everted nipples. Colostrum easy to express and flowing well from right breast. Mom reports less tenderness to right breast. Mom reports + breast changes with pregnancy.   Attempted to Latch Twin A to left breast in the laid back cross cradle hold. Infant latched easily and mom reports pain with feeding. Mom reports her breasts and nipples are very tender. Infant with a few suckles and then fell asleep. She was spoon fed 3 cc colostrum and fell asleep. We attempted to latch her to the right breast in the side lying position and mom again reported pain/pinching with feeding. Infant is noted to pull tongue back in mouth when suckling on gloved finger and has a high palate. Enc parents to perform suck training on infant prior to latch. Infant noted to have small mouth and recessed chin. Infant with good tongue extension when spoon feeding and rhythmic suckle on gloved finger.   Twin B "Chase" was attempted to latch to right breast, he suckled a few times and fell asleep. He is noted to have small  mouth and high palate. Spoon fed infant 3 cc colostrum and he tolerated it well. Mom did not c/o pain/pinching with Twin B.   Infant both left STS with mom. Enc mom to awaken second twin when feeding first twin. Feeding logs given with instructions for use. Mom has 2 Spectra S1 pumps for home use. Mom has a BF pillow at home and dad is planning to go pick it up later.   BF Resources handout and [facility] Broch[facility] reviewed. Mom informed of IP/OP Services, BF Support Groups and LC phone #. Enc mom to call out to desk for feeding assistance as needed.      Maternal Data Formula Feeding for Exclusion: No Has patient been taught Hand Expression?: Yes Does the patient have breastfeeding experience prior to this delivery?: No  Feeding Feeding Type: Breast Fed Length of feed: 2 min  LATCH Score/Interventions Latch: Repeated attempts needed to sustain latch, nipple held in mouth throughout feeding, stimulation needed to elicit sucking reflex. Intervention(s): Adjust position;Assist with latch;Breast massage;Breast compression  Audible Swallowing: None Intervention(s): Hand expression;Skin to skin  Type of Nipple: Everted at rest and after stimulation  Comfort (Breast/Nipple): Filling, red/small blisters or bruises, mild/mod discomfort  Problem noted: Mild/Moderate discomfort (breasts and nipples very tender)  Hold (Positioning): Full assist, staff holds infant at breast Intervention(s): Breastfeeding basics reviewed;Support Pillows;Position options;Skin to skin  LATCH Score: 4  Lactation Tools Discussed/Used WIC Program: No   Consult Status Consult Status: Follow-up  Date: 14-Apr-2016 Follow-up type: In-patient    Silas FloodSharon S Sheelah Ritacco 20-Oct-2016, 12:38 PM

## 2016-03-08 NOTE — Plan of Care (Signed)
Problem: Education: Goal: Knowledge of [facility] Education information/materials will improve Outcome: Completed/Met Date Met: 03/08/16 Admission paperwork reviewed with patient and significant other. Safety, policies and procedures, baby and me booklet, and unit protocols discussed. Patient and significant other verbalize understanding of information.   Problem: Safety: Goal: Ability to remain free from injury will improve Outcome: Completed/Met Date Met: 03/08/16 Encouraged patient not to ambulate without staff assist until told otherwise.

## 2016-03-08 NOTE — Anesthesia Postprocedure Evaluation (Signed)
Anesthesia Post Note  Patient: Lori [name]  Procedure(s) Performed: Procedure(s) (LRB): CESAREAN SECTION MULTI-GESTATIONAL (N/A)  Patient location during evaluation: Mother Baby Anesthesia Type: Spinal Level of consciousness: awake and alert and oriented Pain management: satisfactory to patient Vital Signs Assessment: post-procedure vital signs reviewed and stable Respiratory status: spontaneous breathing and nonlabored ventilation Cardiovascular status: stable Postop Assessment: no headache, no backache, patient able to bend at knees, no signs of nausea or vomiting and adequate PO intake Anesthetic complications: no        Last Vitals:  Vitals:   03/08/16 1752 03/08/16 1755  BP: 134/73 135/84  Pulse: 78 82  Resp: 18 (!) 22  Temp:      Last Pain:  Vitals:   03/08/16 1630  TempSrc: Oral  PainSc: 3    Pain Goal: Patients Stated Pain Goal: 3 (03/08/16 1410)               Madison HickmanGREGORY,Kimblery Diop

## 2016-03-08 NOTE — Op Note (Signed)
03/08/2016  10:53 AM  PATIENT:  Lori [name]  31 y.o. female  PRE-OPERATIVE DIAGNOSIS:  DI/DI TWINS EDD: 03/26/16, malpresentation of Baby B; mild preeclampsia  POST-OPERATIVE DIAGNOSIS:  same  PROCEDURE:  Procedure(s): CESAREAN SECTION MULTI-GESTATIONAL (N/A)  SURGEON:  Surgeon(s) and Role:    * Carrington ClampMichelle Kristof Nadeem, MD - Primary    * Marlow Baarsyanna Clark, MD - Assisting  ANESTHESIA:   spinal  EBL:  Total I/O In: 3000 [I.V.:3000] Out: 700 [Urine:100; Blood:600]  SPECIMEN:  Source of Specimen:  placentas  DISPOSITION OF SPECIMEN:  PATHOLOGY  COUNTS:  YES  DICTATION: .Note written in EPIC  PLAN OF CARE: Admit to inpatient   PATIENT DISPOSITION:  PACU - hemodynamically stable.   Delay start of Pharmacological VTE agent (>24hrs) due to surgical blood loss or risk of bleeding: not applicable   Complications:  none Medications:  Ancef, Pitocin Findings:  Baby A female, Apgars 8,9, weight P.  Baby B female, breech, Apgars 9,9.   Normal tubes, ovaries and uterus seen.  Babies were skin to skin with parents after birth in the OR.  Technique:  After adequate spinal anesthesia was achieved, the patient was prepped and draped in usual sterile fashion.  A foley catheter was used to drain the bladder.  A pfannanstiel incision was made with the scalpel and carried down to the fascia with the bovie cautery. The fascia was incised in the midline with the scalpel and carried in a transverse curvilinear manner bilaterally.  The fascia was reflected superiorly and inferiorly off the rectus muscles and the muscles split in the midline.  A bowel free portion of the peritoneum was entered bluntly and then extended in a superior and inferior manner with good visualization of the bowel and bladder.  The Alexis instrument was then placed and the vesico-uterine fascia tented up and incised in a transverse curvilinear manner.  A 2 cm transverse incision was made in the upper portion of the lower uterine  segment until the amnion was exposed.  The incision was extended transversely in a blunt manner.  Clear fluid was noted and Baby A delivered in the vertex presentation without complication.  The baby was bulb suctioned and the cord was clamped and cut.  The baby was then handed to awaiting Neonatology.  Baby B was delivered footling breech without complication, bulb suctioned, cord clamped and cut and the baby handed to the other Neonatologist.  Cord bloods were obtained for both babies. The placenta was then delivered manually and the uterus cleared of all debris.  The uterine incision was then closed with a running lock stitch of 0 monocryl.  An imbricating layer of 0 monocryl was closed as well. Excellent hemostasis of the uterine incision was achieved and the abdomen was cleared with irrigation.  The peritoneum was closed with a running stitch of 2-0 vicryl.  This incorporated the rectus muscles as a separate layer.  The fascia was then closed with a running stitch of 0 vicryl.  The subcutaneous layer was closed with interrupted  stitches of 2-0 plain gut.  The skin was closed with 4-0 vicryl on a Keith needle and steri-strips.  The patient tolerated the procedure well and was returned to the recovery room in stable condition.  All counts were correct times three.  Ashawn Rinehart A

## 2016-03-09 ENCOUNTER — Encounter (HOSPITAL_COMMUNITY): Admission: RE | Admit: 2016-03-09 | Payer: 59 | Source: Ambulatory Visit

## 2016-03-09 ENCOUNTER — Encounter (HOSPITAL_COMMUNITY): Payer: Self-pay | Admitting: Obstetrics and Gynecology

## 2016-03-09 HISTORY — DX: Personal history of other infectious and parasitic diseases: Z86.19

## 2016-03-09 LAB — CBC
HCT: 22.1 % — ABNORMAL LOW (ref 36.0–46.0)
Hemoglobin: 7.3 g/dL — ABNORMAL LOW (ref 12.0–15.0)
MCH: 23.6 pg — ABNORMAL LOW (ref 26.0–34.0)
MCHC: 33 g/dL (ref 30.0–36.0)
MCV: 71.5 fL — ABNORMAL LOW (ref 78.0–100.0)
PLATELETS: 314 10*3/uL (ref 150–400)
RBC: 3.09 MIL/uL — ABNORMAL LOW (ref 3.87–5.11)
RDW: 17.1 % — AB (ref 11.5–15.5)
WBC: 15.8 10*3/uL — AB (ref 4.0–10.5)

## 2016-03-09 NOTE — Progress Notes (Signed)
Patient is doing well.  She is tolerating PO, ambulating, voiding.  Pain is controlled.  Lochia is appropriate  Vitals:   03/08/16 1752 03/08/16 1755 03/08/16 2107 03/09/16 0134  BP: 134/73 135/84 112/65 115/70  Pulse: 78 82 80 71  Resp: 18 (!) 22 20 18   Temp:   98.3 F (36.8 C) 97.8 F (36.6 C)  TempSrc:   Oral Oral  SpO2: 98% 98%    Weight:      Height:        NAD Abdomen:  soft, appropriate tenderness, incisions intact and without erythema or drainage ext:    Symmetric, 1+ edema bilaterally, symmetric  Lab Results  Component Value Date   WBC 15.8 (H) 03/09/2016   HGB 7.3 (L) 03/09/2016   HCT 22.1 (L) 03/09/2016   MCV 71.5 (L) 03/09/2016   PLT 314 03/09/2016    --/--/B POS (02/13 1138)/RImmune  A/P    30 y.o. G1P1002 POD 1 s/p primary CD for twins Routine post op and postpartum care.   BPs wnl ALBA on top of IDA--hgb 7.  PO iron on discharge  Desires circumcision. Discussed r/b/a of the procedure. Reviewed that circumcision is an elective surgical procedure and not considered medically necessary. Reviewed the risks of the procedure including the risk of infection, bleeding, damage to surrounding structures, including scrotum, shaft, urethra and head of penis, and an undesired cosmetic effect requiring additional procedures for revision. Consent signed.

## 2016-03-09 NOTE — Lactation Note (Addendum)
This note was copied from a baby's chart. Lactation Consultation Note  Patient Name: Lori Gillie MannersHeather [name] NWGNF'AToday's Date: 03/09/2016 Reason for consult: Follow-up assessment;Other (Comment);Multiple gestation (Early Term infants)   Follow up with mom of Early term twins at 5730 hours old. Both infant recently were fed bottles of formula and are both asleep.   Mom reports they started supplementing infants today. We reviewed supplementation guidelines that was in the room and encouraged parents to increase supplement based on day of age and to offer breast milk first, followed by formula as needed. Mom reports she did not latch infants this afternoon since Chase had his circumcision.   Mom reports she has pumped once and did not get any colostrum. Enc mom to pump every 3 hour post BF to stimulate milk production. Mom reports she is using the Initiate setting when pumping. Mom report she was waiting on a plan for pumping. Enc mom to hand express post pumping. Breast milk storage guidelines reviewed. Enc parents to call for assistance as needed. Mom reports she knows how to hand express.   Plan made with mom and written on board: Breast feed at breast at least every 3 hours Supplement infants with EBM/formula based on supplementation guidelines Pump for 15 minutes with DEBP on Initiate setting Hand express post pumping Call out for BF assistance as needed.      Maternal Data Formula Feeding for Exclusion: No Has patient been taught Hand Expression?: Yes Does the patient have breastfeeding experience prior to this delivery?: No  Feeding    LATCH Score/Interventions                      Lactation Tools Discussed/Used Pump Review: Setup, frequency, and cleaning;Milk Storage Initiated by:: Reviewed and encouraged   Consult Status Consult Status: Follow-up Date: 03/10/16 Follow-up type: In-patient    Silas FloodSharon S Obbie Lewallen 03/09/2016, 5:33 PM

## 2016-03-10 NOTE — Lactation Note (Addendum)
This note was copied from a baby's chart. Lactation Consultation Note  Mom reports that she has been attempting to BF the babies at each feeding.  Baby girl attaches to the breast but quickly fall asleep.  Baby B is not attaching well. All attempts are being followed by formula.  Encouraged mother to continue offering the breast and to call for latching assistance if desired.  Mom advised to pump every 2 hours for the rest of the day as she has only pumped once today. Formula changed from Alimentum to Similac Advanced because they are not receiving much breast milk if any at all. RN notified of change. Follow-up tomorrow or sooner if desired.  Patient Name: Girl Gillie MannersHeather Plyler ZOXWR'UToday's Date: 03/10/2016 Reason for consult: Follow-up assessment   Maternal Data    Feeding Feeding Type: Bottle Fed - Formula  LATCH Score/Interventions                      Lactation Tools Discussed/Used     Consult Status Consult Status: Follow-up Date: 03/11/16 Follow-up type: In-patient    Soyla DryerJoseph, Lakia Gritton 03/10/2016, 3:07 PM

## 2016-03-11 LAB — TYPE AND SCREEN
Blood Product Expiration Date: 201803082359
Unit Type and Rh: 5100

## 2016-03-11 MED ORDER — OXYCODONE-ACETAMINOPHEN 5-325 MG PO TABS: 1.0000 | ORAL_TABLET | ORAL | 0 refills | Status: DC | PRN

## 2016-03-11 MED ORDER — IBUPROFEN 600 MG PO TABS: 600.0000 mg | ORAL_TABLET | Freq: Four times a day (QID) | ORAL | 0 refills | Status: DC | PRN

## 2016-03-11 NOTE — Discharge Summary (Signed)
Obstetric Discharge Summary Reason for Admission: cesarean section Prenatal Procedures: NST and ultrasound Intrapartum Procedures: cesarean: low cervical, transverse Postpartum Procedures: none Complications-Operative and Postpartum: none Hemoglobin  Date Value Ref Range Status  03/09/2016 7.3 (L) 12.0 - 15.0 g/dL Final    Comment:    DELTA CHECK NOTED REPEATED TO VERIFY    HCT  Date Value Ref Range Status  03/09/2016 22.1 (L) 36.0 - 46.0 % Final    Physical Exam:  General: alert, cooperative and appears stated age 71Lochia: appropriate Uterine Fundus: firm Incision: healing well DVT Evaluation: No evidence of DVT seen on physical exam.  Discharge Diagnoses: Term Pregnancy-delivered  Discharge Information: Date: 03/11/2016 Activity: pelvic rest Diet: routine Medications: Ibuprofen, Colace and Percocet Condition: improved Instructions: refer to practice specific booklet Discharge to: home Follow-up Information    HORVATH,MICHELLE A, MD Follow up in 4 week(s).   Specialty:  Obstetrics and Gynecology Why:  For a post-operative evaluation Contact information: [address] [place] [address] [phone]           Newborn Data:   Kendrick FriesCollins, Girl Shandrika [[id number]][[id number]]  Live born female  Birth Weight: 6 lb 15.1 oz (3150 g) APGAR: 8, 9   Loma SenderCollins, BoyB Nakota [[id number]][[id number]]  Live born female  Birth Weight: 6 lb 4.7 oz (2855 g) APGAR: 9, 9  Home with mother.  Edison Wollschlager H. 03/11/2016, 9:35 AM

## 2016-03-11 NOTE — Progress Notes (Addendum)
Subjective: Postpartum Day 2: Cesarean Delivery Patient reports adequate pain control, No nausea or vomiting, ambulating without difficulty  Objective: Vital signs in last 24 hours: AFVSS  Physical Exam:  General: alert, cooperative and appears stated age Lochia: appropriate Uterine Fundus: firm Incision: healing well DVT Evaluation: No evidence of DVT seen on physical exam.   Recent Labs  03/09/16 0616  HGB 7.3*  HCT 22.1*    Assessment/Plan: Status post Cesarean section. Doing well postoperatively.  Continue current care. Breast and bottle feeding  Jeni Duling H. 03/11/2016, 9:31 AM

## 2016-04-13 ENCOUNTER — Other Ambulatory Visit: Payer: Self-pay | Admitting: Obstetrics and Gynecology

## 2016-04-14 LAB — CYTOLOGY - PAP

## 2016-06-02 ENCOUNTER — Emergency Department: Payer: 59

## 2016-06-02 ENCOUNTER — Emergency Department
Admission: EM | Admit: 2016-06-02 | Discharge: 2016-06-02 | Disposition: A | Discharge status: Home / Self Care | Payer: 59 | Attending: Emergency Medicine | Admitting: Emergency Medicine

## 2016-06-02 ENCOUNTER — Encounter: Payer: Self-pay | Admitting: Emergency Medicine

## 2016-06-02 DIAGNOSIS — Z87891 Personal history of nicotine dependence: Secondary | ICD-10-CM | POA: Diagnosis not present

## 2016-06-02 DIAGNOSIS — R319 Hematuria, unspecified: Secondary | ICD-10-CM | POA: Diagnosis not present

## 2016-06-02 DIAGNOSIS — N23 Unspecified renal colic: Secondary | ICD-10-CM | POA: Insufficient documentation

## 2016-06-02 DIAGNOSIS — R109 Unspecified abdominal pain: Secondary | ICD-10-CM

## 2016-06-02 DIAGNOSIS — R1031 Right lower quadrant pain: Secondary | ICD-10-CM | POA: Diagnosis present

## 2016-06-02 LAB — CBC
HCT: 39.9 % (ref 35.0–47.0)
Hemoglobin: 13.1 g/dL (ref 12.0–16.0)
MCH: 25.8 pg — AB (ref 26.0–34.0)
MCHC: 32.9 g/dL (ref 32.0–36.0)
MCV: 78.4 fL — ABNORMAL LOW (ref 80.0–100.0)
PLATELETS: 348 10*3/uL (ref 150–440)
RBC: 5.09 MIL/uL (ref 3.80–5.20)
RDW: 17.6 % — ABNORMAL HIGH (ref 11.5–14.5)
WBC: 7.3 10*3/uL (ref 3.6–11.0)

## 2016-06-02 LAB — BASIC METABOLIC PANEL
Anion gap: 8 (ref 5–15)
BUN: 15 mg/dL (ref 6–20)
CALCIUM: 9.3 mg/dL (ref 8.9–10.3)
CO2: 23 mmol/L (ref 22–32)
CREATININE: 0.87 mg/dL (ref 0.44–1.00)
Chloride: 106 mmol/L (ref 101–111)
GFR calc Af Amer: >60 mL/min (ref >60)
GFR calc non Af Amer: >60 mL/min (ref >60)
GLUCOSE: 98 mg/dL (ref 65–99)
Potassium: 3.8 mmol/L (ref 3.5–5.1)
Sodium: 137 mmol/L (ref 135–145)

## 2016-06-02 LAB — URINALYSIS, COMPLETE (UACMP) WITH MICROSCOPIC
BILIRUBIN URINE: NEGATIVE
Bacteria, UA: NONE SEEN
GLUCOSE, UA: NEGATIVE mg/dL
KETONES UR: NEGATIVE mg/dL
Leukocytes, UA: NEGATIVE
NITRITE: NEGATIVE
PH: 5 (ref 5.0–8.0)
Protein, ur: 100 mg/dL — AB
Specific Gravity, Urine: 1.028 (ref 1.005–1.030)

## 2016-06-02 LAB — POCT PREGNANCY, URINE: PREG TEST UR: NEGATIVE

## 2016-06-02 MED ORDER — KETOROLAC TROMETHAMINE 30 MG/ML IJ SOLN
INTRAMUSCULAR | Status: AC
  Filled 2016-06-02: qty 1

## 2016-06-02 MED ORDER — ONDANSETRON 4 MG PO TBDP: 4.0000 mg | ORAL_TABLET | Freq: Three times a day (TID) | ORAL | 0 refills | Status: DC | PRN

## 2016-06-02 MED ORDER — KETOROLAC TROMETHAMINE 60 MG/2ML IM SOLN
15.0000 mg | Freq: Once | INTRAMUSCULAR | Status: AC
  Administered 2016-06-02: 15 mg via INTRAMUSCULAR

## 2016-06-02 MED ORDER — ONDANSETRON 4 MG PO TBDP
ORAL_TABLET | ORAL | Status: AC
  Filled 2016-06-02: qty 1

## 2016-06-02 MED ORDER — ONDANSETRON 4 MG PO TBDP
8.0000 mg | ORAL_TABLET | Freq: Once | ORAL | Status: AC
  Administered 2016-06-02: 8 mg via ORAL
  Filled 2016-06-02: qty 2

## 2016-06-02 MED ORDER — KETOROLAC TROMETHAMINE 10 MG PO TABS: 10.0000 mg | ORAL_TABLET | Freq: Four times a day (QID) | ORAL | 0 refills | Status: DC | PRN

## 2016-06-02 NOTE — ED Provider Notes (Signed)
[facility] Emergency Department Provider Note  ____________________________________________  Time seen: Approximately 4:36 PM  I have reviewed the triage vital signs and the nursing notes.   HISTORY  Chief Complaint Flank Pain    HPI Lori [name] is a 31 y.o. female who complains of right flank pain radiating to the right lower quadrant since yesterday. Intermittent, moderate to severe in intensity, sharp. No aggravating or alleviating factors. Feels just like previous kidney stones. She's never had to have intervention for the mouse passed spontaneously within a few days. Had some nausea and vomiting, no constipation or diarrhea. No fever or chills. No chest pain or shortness of breath. No dysuria frequency urgency. She is 3 months postpartum with twins, doing well. She is not breast-feeding.     Past Medical History:  Diagnosis Date  . Allergy   . History of shingles    as a child  . Kidney stone   . Kidney stones   . Migraines      Patient Active Problem List   Diagnosis Date Noted  . Postoperative state 03/08/2016  . Twin pregnancy, twins dichorionic and diamniotic 02/06/2016  . Increased nuchal translucency space on fetal ultrasound 09/20/2015  . Chronic cholecystitis 01/25/2011  . Biliary dyskinesia 12/27/2010  . Abdominal pain 12/27/2010  . S/P colonoscopy 12/27/2010  . S/P endoscopy 12/27/2010     Past Surgical History:  Procedure Laterality Date  . CESAREAN SECTION MULTI-GESTATIONAL N/A 03/08/2016   Procedure: CESAREAN SECTION MULTI-GESTATIONAL;  Surgeon: Carrington ClampMichelle Horvath, MD;  [facility];  Service: Obstetrics;  Laterality: N/A;  . CHOLECYSTECTOMY  12/28/2010   Procedure: LAPAROSCOPIC CHOLECYSTECTOMY;  Surgeon: Almond LintFaera Byerly, MD;  Location: [facility] ORS;  Service: General;  Laterality: N/A;  . COLONOSCOPY  12/26/2010  . minor skin excision for a mole     . UPPER GASTROINTESTINAL ENDOSCOPY  12/26/2010  . WISDOM TOOTH  EXTRACTION       Prior to Admission medications   Medication Sig Start Date End Date Taking? Authorizing Provider  acetaminophen (TYLENOL) 500 MG tablet Take 1,000 mg by mouth every 6 (six) hours as needed for mild pain, moderate pain or headache.    [provider]  docusate sodium (COLACE) 100 MG capsule Take 200 mg by mouth daily.    [provider]  EPINEPHrine (EPIPEN 2-PAK) 0.3 mg/0.3 mL IJ SOAJ injection Inject 0.3 mg into the muscle once as needed (for severe allergic reaction).    [provider]  ferrous sulfate 325 (65 FE) MG tablet Take 1 tablet (325 mg total) by mouth daily with breakfast. Patient not taking: Reported on 03/02/2016 02/24/16   Mumaw, Hiram ComberElizabeth Woodland, DO  hydrocortisone-pramoxine (PRO[facility]OFOAM [facility]) rectal foam Place 1 applicator rectally 2 (two) times daily. Patient taking differently: Place 1 applicator rectally 3 (three) times daily.  02/11/16   Aviva SignsWilliams, Marie L, CNM  ibuprofen (ADVIL,MOTRIN) 600 MG tablet Take 1 tablet (600 mg total) by mouth every 6 (six) hours as needed. 03/11/16   Waynard Reedsoss, Kendra, MD  ketorolac (TORADOL) 10 MG tablet Take 1 tablet (10 mg total) by mouth every 6 (six) hours as needed for moderate pain. 06/02/16   Sharman CheekStafford, Emmarose Klinke, MD  ondansetron (ZOFRAN ODT) 4 MG disintegrating tablet Take 1 tablet (4 mg total) by mouth every 8 (eight) hours as needed for nausea or vomiting. 06/02/16   Sharman CheekStafford, Breeanna Galgano, MD  oxyCODONE-acetaminophen (ROXICET) 5-325 MG tablet Take 1-2 tablets by mouth every 4 (four) hours as needed for severe pain. 03/11/16  Waynard Reeds, MD  Prenatal MV-Min-FA-Omega-3 (PRENATAL GUMMIES/DHA & FA) 0.4-32.5 MG CHEW Chew 2 tablets by mouth daily. VITAFUSION    [provider]     Allergies Bee venom; Food; Amoxicillin-pot clavulanate; Morphine and related; and Penicillins   Family History  Problem Relation Age of Onset  . Hypertension Maternal Grandmother   . Diabetes Maternal Grandfather   .  Hyperlipidemia Maternal Grandfather   . Heart disease Maternal Grandfather   . Hypertension Maternal Grandfather   . Stroke Maternal Grandfather     Social History Social History  Substance Use Topics  . Smoking status: Former Smoker    Types: Cigarettes  . Smokeless tobacco: Former Neurosurgeon    Quit date: 12/26/2008  . Alcohol use No    Review of Systems  Constitutional:   No fever or chills.  ENT:   No sore throat. No rhinorrhea. Lymphatic: No swollen glands, No extremity swelling Endocrine: No hot/cold flashes. No significant weight change. No neck swelling. Cardiovascular:   No chest pain or syncope. Respiratory:   No dyspnea or cough. Gastrointestinal:   Right flank pain and vomiting as above..  Genitourinary:   Negative for dysuria or difficulty urinating. Musculoskeletal:   Negative for focal pain or swelling Neurological:   Negative for headaches or weakness. All other systems reviewed and are negative except as documented above in ROS and HPI.  ____________________________________________   PHYSICAL EXAM:  VITAL SIGNS: ED Triage Vitals  Enc Vitals Group     BP 06/02/16 1307 (!) 144/91     Pulse Rate 06/02/16 1307 67     Resp 06/02/16 1307 18     Temp 06/02/16 1307 98.3 F (36.8 C)     Temp Source 06/02/16 1307 Oral     SpO2 06/02/16 1307 99 %     Weight 06/02/16 1307 190 lb (86.2 kg)     Height 06/02/16 1307 5\' 4"  (1.626 m)     Head Circumference --      Peak Flow --      Pain Score 06/02/16 1306 8     Pain Loc --      Pain Edu? --      Excl. in GC? --     Vital signs reviewed, nursing assessments reviewed.   Constitutional:   Alert and oriented. Well appearing and in no distress. Eyes:   No scleral icterus. No conjunctival pallor. PERRL. EOMI.  No nystagmus. ENT   Head:   Normocephalic and atraumatic.   Nose:   No congestion/rhinnorhea. No septal hematoma   Mouth/Throat:   MMM, no pharyngeal erythema. No peritonsillar mass.    Neck:   No  stridor. No SubQ emphysema. No meningismus. Hematological/Lymphatic/Immunilogical:   No cervical lymphadenopathy. Cardiovascular:   RRR. Symmetric bilateral radial and DP pulses.  No murmurs.  Respiratory:   Normal respiratory effort without tachypnea nor retractions. Breath sounds are clear and equal bilaterally. No wheezes/rales/rhonchi. Gastrointestinal:   Soft and nontender. Non distended. There is no CVA tenderness.  No rebound, rigidity, or guarding.C-section scar well-healed. Genitourinary:   deferred Musculoskeletal:   Normal range of motion in all extremities. No joint effusions.  No lower extremity tenderness.  No edema. Neurologic:   Normal speech and language.  CN 2-10 normal. Motor grossly intact. No gross focal neurologic deficits are appreciated.  Skin:    Skin is warm, dry and intact. No rash noted.  No petechiae, purpura, or bullae.  ____________________________________________    LABS (pertinent positives/negatives) (all labs ordered are listed,  but only abnormal results are displayed) Labs Reviewed  URINALYSIS, COMPLETE (UACMP) WITH MICROSCOPIC - Abnormal; Notable for the following:       Result Value   Color, Urine YELLOW (*)    APPearance CLOUDY (*)    Hgb urine dipstick LARGE (*)    Protein, ur 100 (*)    Squamous Epithelial / LPF 0-5 (*)    All other components within normal limits  CBC - Abnormal; Notable for the following:    MCV 78.4 (*)    MCH 25.8 (*)    RDW 17.6 (*)    All other components within normal limits  URINE CULTURE  BASIC METABOLIC PANEL  POC URINE PREG, ED  POCT PREGNANCY, URINE   ____________________________________________   EKG    ____________________________________________    RADIOLOGY  Dg Abdomen 1 View  Result Date: 06/02/2016 CLINICAL DATA:  RIGHT flank pain. EXAM: ABDOMEN - 1 VIEW COMPARISON:  None. FINDINGS: The bowel gas pattern is normal. No radio-opaque calculi or other significant radiographic abnormality are  seen. Cholecystectomy clips. IUD. Moderate stool burden. IMPRESSION: Negative. Electronically Signed   By: Elsie Stain M.D.   On: 06/02/2016 15:34    ____________________________________________   PROCEDURES Procedures  ____________________________________________   INITIAL IMPRESSION / ASSESSMENT AND PLAN / ED COURSE  Pertinent labs & imaging results that were available during my care of the patient were reviewed by me and considered in my medical decision making (see chart for details).   Clinical Course as of Jun 03 1634  Fri Jun 02, 2016  1521 Clinically c/w ureterolithiasis. Will check kub for size/position assessment.  [PS]    Clinical Course User Index [PS] Sharman Cheek, MD    ----------------------------------------- 4:39 PM on 06/02/2016 -----------------------------------------  KUB negative. Symptoms highly consistent with ureteral colic with hematuria and calcium oxalate crystals on urinalysis. Patient is well-appearing, calm and comfortable. Low suspicion for ureteral obstruction or hydronephrosis. Presentation not consistent with urinary tract infection or pyelonephritis. No evidence of sepsis. We'll treat symptomatically, expect symptoms to improve within a few days. Return precautions given. Follow up with primary care. Patient had significant relief with Toradol and we will continue this for the next few days.Considering the patient's symptoms, medical history, and physical examination today, I have low suspicion for cholecystitis or biliary pathology, pancreatitis, perforation or bowel obstruction, hernia, intra-abdominal abscess, AAA or dissection, volvulus or intussusception, mesenteric ischemia, or appendicitis.     ____________________________________________   FINAL CLINICAL IMPRESSION(S) / ED DIAGNOSES  Final diagnoses:  Right flank pain  Ureteral colic      New Prescriptions   KETOROLAC (TORADOL) 10 MG TABLET    Take 1 tablet (10 mg  total) by mouth every 6 (six) hours as needed for moderate pain.   ONDANSETRON (ZOFRAN ODT) 4 MG DISINTEGRATING TABLET    Take 1 tablet (4 mg total) by mouth every 8 (eight) hours as needed for nausea or vomiting.     Portions of this note were generated with dragon dictation software. Dictation errors may occur despite best attempts at proofreading.    Sharman Cheek, MD 06/02/16 1640

## 2016-06-02 NOTE — ED Triage Notes (Signed)
Pt c/o right flank pain and RLQ pain. Hx of kidney stones, feels like normal stone.  3 months PP.  Had one period and now has IUD.  Has had nausea. No fevers. No hematuria.

## 2016-06-03 LAB — URINE CULTURE: Culture: <10000 — AB

## 2016-08-04 NOTE — Addendum Note (Signed)
Addendum  created 08/04/16 1522 by Desteni Piscopo, MD   Sign clinical note    

## 2016-08-04 NOTE — Anesthesia Postprocedure Evaluation (Signed)
Anesthesia Post Note  Patient: Lori [name]  Procedure(s) Performed: Procedure(s) (LRB): CESAREAN SECTION MULTI-GESTATIONAL (N/A)     Anesthesia Post Evaluation  Last Vitals:  Vitals:   03/10/16 1822 03/11/16 0535  BP: (!) 149/71 125/73  Pulse: 78 77  Resp: 16 18  Temp: 36.8 C 36.5 C    Last Pain:  Vitals:   03/11/16 0835  TempSrc:   PainSc: 5                  [name] [name]

## 2016-08-24 ENCOUNTER — Emergency Department (HOSPITAL_COMMUNITY): Payer: 59

## 2016-08-24 ENCOUNTER — Emergency Department (HOSPITAL_COMMUNITY)
Admission: EM | Admit: 2016-08-24 | Discharge: 2016-08-24 | Disposition: A | Discharge status: Home / Self Care | Payer: 59 | Attending: Emergency Medicine | Admitting: Emergency Medicine

## 2016-08-24 ENCOUNTER — Encounter (HOSPITAL_COMMUNITY): Payer: Self-pay | Admitting: Emergency Medicine

## 2016-08-24 DIAGNOSIS — N23 Unspecified renal colic: Secondary | ICD-10-CM | POA: Diagnosis not present

## 2016-08-24 DIAGNOSIS — E860 Dehydration: Secondary | ICD-10-CM

## 2016-08-24 DIAGNOSIS — N132 Hydronephrosis with renal and ureteral calculous obstruction: Secondary | ICD-10-CM | POA: Diagnosis not present

## 2016-08-24 DIAGNOSIS — N3001 Acute cystitis with hematuria: Secondary | ICD-10-CM

## 2016-08-24 DIAGNOSIS — R109 Unspecified abdominal pain: Secondary | ICD-10-CM

## 2016-08-24 DIAGNOSIS — Z87891 Personal history of nicotine dependence: Secondary | ICD-10-CM | POA: Insufficient documentation

## 2016-08-24 DIAGNOSIS — R1084 Generalized abdominal pain: Secondary | ICD-10-CM | POA: Diagnosis present

## 2016-08-24 LAB — COMPREHENSIVE METABOLIC PANEL
ALK PHOS: 97 U/L (ref 38–126)
ALT: 19 U/L (ref 14–54)
ANION GAP: 6 (ref 5–15)
AST: 21 U/L (ref 15–41)
Albumin: 4 g/dL (ref 3.5–5.0)
BILIRUBIN TOTAL: 0.8 mg/dL (ref 0.3–1.2)
BUN: 11 mg/dL (ref 6–20)
CALCIUM: 9.2 mg/dL (ref 8.9–10.3)
CO2: 22 mmol/L (ref 22–32)
Chloride: 111 mmol/L (ref 101–111)
Creatinine, Ser: 0.93 mg/dL (ref 0.44–1.00)
GFR calc non Af Amer: >60 mL/min (ref >60)
Glucose, Bld: 101 mg/dL — ABNORMAL HIGH (ref 65–99)
POTASSIUM: 3.9 mmol/L (ref 3.5–5.1)
Sodium: 139 mmol/L (ref 135–145)
TOTAL PROTEIN: 6.6 g/dL (ref 6.5–8.1)

## 2016-08-24 LAB — URINALYSIS, ROUTINE W REFLEX MICROSCOPIC
Bilirubin Urine: NEGATIVE
GLUCOSE, UA: NEGATIVE mg/dL
Ketones, ur: NEGATIVE mg/dL
NITRITE: NEGATIVE
Protein, ur: 100 mg/dL — AB
Specific Gravity, Urine: 1.023 (ref 1.005–1.030)
pH: 6 (ref 5.0–8.0)

## 2016-08-24 LAB — CBC
HEMATOCRIT: 40.2 % (ref 36.0–46.0)
HEMOGLOBIN: 13.1 g/dL (ref 12.0–15.0)
MCH: 26.8 pg (ref 26.0–34.0)
MCHC: 32.6 g/dL (ref 30.0–36.0)
MCV: 82.2 fL (ref 78.0–100.0)
Platelets: 379 10*3/uL (ref 150–400)
RBC: 4.89 MIL/uL (ref 3.87–5.11)
RDW: 16.4 % — ABNORMAL HIGH (ref 11.5–15.5)
WBC: 11.3 10*3/uL — ABNORMAL HIGH (ref 4.0–10.5)

## 2016-08-24 LAB — LIPASE, BLOOD: Lipase: 48 U/L (ref 11–51)

## 2016-08-24 MED ORDER — IBUPROFEN 600 MG PO TABS: 600.0000 mg | ORAL_TABLET | Freq: Four times a day (QID) | ORAL | 0 refills | Status: AC | PRN

## 2016-08-24 MED ORDER — ONDANSETRON HCL 4 MG/2ML IJ SOLN
4.0000 mg | INTRAMUSCULAR | Status: AC
  Administered 2016-08-24: 4 mg via INTRAVENOUS
  Filled 2016-08-24: qty 2

## 2016-08-24 MED ORDER — TAMSULOSIN HCL 0.4 MG PO CAPS
0.4000 mg | ORAL_CAPSULE | Freq: Every day | ORAL | 0 refills | Status: DC
Start: 2016-08-24 — End: 2016-09-20

## 2016-08-24 MED ORDER — HYDROMORPHONE HCL 1 MG/ML IJ SOLN
0.5000 mg | Freq: Once | INTRAMUSCULAR | Status: AC
  Administered 2016-08-24: 0.5 mg via INTRAVENOUS
  Filled 2016-08-24: qty 1

## 2016-08-24 MED ORDER — NITROFURANTOIN MONOHYD MACRO 100 MG PO CAPS: 100.0000 mg | ORAL_CAPSULE | Freq: Two times a day (BID) | ORAL | 0 refills | Status: DC

## 2016-08-24 MED ORDER — ONDANSETRON 4 MG PO TBDP
ORAL_TABLET | ORAL | Status: AC
  Filled 2016-08-24: qty 1

## 2016-08-24 MED ORDER — HYDROCODONE-ACETAMINOPHEN 5-325 MG PO TABS: 1.0000 | ORAL_TABLET | Freq: Two times a day (BID) | ORAL | 0 refills | Status: DC | PRN

## 2016-08-24 MED ORDER — ONDANSETRON 8 MG PO TBDP: 8.0000 mg | ORAL_TABLET | Freq: Three times a day (TID) | ORAL | 0 refills | Status: DC | PRN

## 2016-08-24 MED ORDER — KETOROLAC TROMETHAMINE 15 MG/ML IJ SOLN
15.0000 mg | Freq: Once | INTRAMUSCULAR | Status: AC
  Administered 2016-08-24: 15 mg via INTRAVENOUS
  Filled 2016-08-24: qty 1

## 2016-08-24 MED ORDER — ONDANSETRON 4 MG PO TBDP
4.0000 mg | ORAL_TABLET | Freq: Once | ORAL | Status: AC | PRN
  Administered 2016-08-24: 4 mg via ORAL

## 2016-08-24 NOTE — ED Triage Notes (Signed)
Onset today at midnight developed right flank pain history of kidney stones and emesis x 3.

## 2016-08-24 NOTE — ED Notes (Signed)
C/o right flank pain onset 12mn , c/o n/v states she was running a fever earlier today. States she has had 2 kidney stones this year.

## 2016-08-24 NOTE — ED Notes (Signed)
Pt ambulated to room and restroom. Pt ambulated with a steady gait and no complaints. Pt placed on bp and o2 monitor.

## 2016-08-24 NOTE — ED Notes (Signed)
Returned from u/s

## 2016-08-24 NOTE — ED Provider Notes (Addendum)
[facility]-EMERGENCY DEPT Provider Note   CSN: [id number] Arrival date & time: 08/24/16  0727     History   Chief Complaint Chief Complaint  Patient presents with  . Dysuria  . Emesis  . Flank Pain    HPI Lori [name] is a 31 y.o. female.  HPI Pt comes in with cc of flank pain. Pt has hx of renal stones and reports that she started having sudden onset R flank pain around midnight. Pain is similar to her renal stones in the past, and is colicky in nature, but this time she also has dysuria. Pt has had some nausea as well. Pt denies any vaginal discharge or bleeding. Pt has had 3 stones in the last 6 months, since she delivered twins. Pt is not breast feeding.    Past Medical History:  Diagnosis Date  . Allergy   . History of shingles    as a child  . Kidney stone   . Kidney stones   . Migraines     Patient Active Problem List   Diagnosis Date Noted  . Postoperative state 03/08/2016  . Twin pregnancy, twins dichorionic and diamniotic 02/06/2016  . Increased nuchal translucency space on fetal ultrasound 09/20/2015  . Chronic cholecystitis 01/25/2011  . Biliary dyskinesia 12/27/2010  . Abdominal pain 12/27/2010  . S/P colonoscopy 12/27/2010  . S/P endoscopy 12/27/2010    Past Surgical History:  Procedure Laterality Date  . CESAREAN SECTION MULTI-GESTATIONAL N/A 03/08/2016   Procedure: CESAREAN SECTION MULTI-GESTATIONAL;  Surgeon: Carrington Clamp, MD;  [facility]: [facility] BIRTHING SUIT[facility];  Service: Obstetrics;  Laterality: N/A;  . CHOLECYSTECTOMY  12/28/2010   Procedure: LAPAROSCOPIC CHOLECYSTECTOMY;  Surgeon: Almond Lint, MD;  Location: [facility] ORS;  Service: General;  Laterality: N/A;  . COLONOSCOPY  12/26/2010  . minor skin excision for a mole     . UPPER GASTROINTESTINAL ENDOSCOPY  12/26/2010  . WISDOM TOOTH EXTRACTION      OB History    Gravida Para Term Preterm AB Living   1 1 1     2    SAB TAB Ectopic Multiple Live Births         1 2       Home Medications     Prior to Admission medications   Medication Sig Start Date End Date Taking? Authorizing Provider  acetaminophen (TYLENOL) 500 MG tablet Take 1,000 mg by mouth every 6 (six) hours as needed for mild pain, moderate pain or headache.    [provider]  docusate sodium (COLACE) 100 MG capsule Take 200 mg by mouth daily.    [provider]  EPINEPHrine (EPIPEN 2-PAK) 0.3 mg/0.3 mL IJ SOAJ injection Inject 0.3 mg into the muscle once as needed (for severe allergic reaction).    [provider]  ferrous sulfate 325 (65 FE) MG tablet Take 1 tablet (325 mg total) by mouth daily with breakfast. Patient not taking: Reported on 03/02/2016 02/24/16   Mumaw, Hiram Comber, DO  HYDROcodone-acetaminophen (NORCO/VICODIN) 5-325 MG tablet Take 1 tablet by mouth every 12 (twelve) hours as needed for severe pain. 08/24/16   Derwood Kaplan, MD  hydrocortisone-pramoxine (PROCTOFOAM [facility]) rectal foam Place 1 applicator rectally 2 (two) times daily. Patient taking differently: Place 1 applicator rectally 3 (three) times daily.  02/11/16   Aviva Signs, CNM  ibuprofen (ADVIL,MOTRIN) 600 MG tablet Take 1 tablet (600 mg total) by mouth every 6 (six) hours as needed. 08/24/16   Derwood Kaplan, MD  ketorolac (TORADOL) 10 MG tablet Take  1 tablet (10 mg total) by mouth every 6 (six) hours as needed for moderate pain. 06/02/16   Sharman Cheek, MD  nitrofurantoin, macrocrystal-monohydrate, (MACROBID) 100 MG capsule Take 1 capsule (100 mg total) by mouth 2 (two) times daily. 08/24/16   Derwood Kaplan, MD  ondansetron (ZOFRAN ODT) 8 MG disintegrating tablet Take 1 tablet (8 mg total) by mouth every 8 (eight) hours as needed for nausea. 08/24/16   Derwood Kaplan, MD  oxyCODONE-acetaminophen (ROXICET) 5-325 MG tablet Take 1-2 tablets by mouth every 4 (four) hours as needed for severe pain. 03/11/16   Waynard Reeds, MD  Prenatal MV-Min-FA-Omega-3 (PRENATAL GUMMIES/DHA & FA) 0.4-32.5 MG CHEW Chew 2 tablets by  mouth daily. VITAFUSION    [provider]  tamsulosin (FLOMAX) 0.4 MG CAPS capsule Take 1 capsule (0.4 mg total) by mouth daily. 08/24/16   Derwood Kaplan, MD    Family History Family History  Problem Relation Age of Onset  . Hypertension Maternal Grandmother   . Diabetes Maternal Grandfather   . Hyperlipidemia Maternal Grandfather   . Heart disease Maternal Grandfather   . Hypertension Maternal Grandfather   . Stroke Maternal Grandfather     Social History Social History  Substance Use Topics  . Smoking status: Former Smoker    Types: Cigarettes  . Smokeless tobacco: Former Neurosurgeon    Quit date: 12/26/2008  . Alcohol use No     Allergies   Bee venom; Food; Amoxicillin-pot clavulanate; Morphine and related; and Penicillins   Review of Systems Review of Systems  Constitutional: Positive for activity change.  Gastrointestinal: Negative for nausea.  Genitourinary: Positive for dysuria and flank pain.  Neurological: Negative for weakness.     Physical Exam Updated Vital Signs BP 130/85 (BP Location: Left Arm)   Pulse 65   Temp 97.6 F (36.4 C) (Oral)   Resp 20   Ht 5\' 4"  (1.626 m)   Wt 83.9 kg (185 lb)   LMP 08/19/2015   SpO2 99%   BMI 31.76 kg/m   Physical Exam  Constitutional: She is oriented to person, place, and time. She appears well-developed.  HENT:  Head: Normocephalic and atraumatic.  Eyes: EOM are normal.  Neck: Normal range of motion. Neck supple.  Cardiovascular: Normal rate.   Pulmonary/Chest: Effort normal.  Abdominal: Bowel sounds are normal. There is no tenderness.  Genitourinary:  Genitourinary Comments: Flank tenderness, R side  Neurological: She is alert and oriented to person, place, and time.  Skin: Skin is warm and dry.  Nursing note and vitals reviewed.    ED Treatments / Results  Labs (all labs ordered are listed, but only abnormal results are displayed) Labs Reviewed  COMPREHENSIVE METABOLIC PANEL - Abnormal; Notable  for the following:       Result Value   Glucose, Bld 101 (*)    All other components within normal limits  CBC - Abnormal; Notable for the following:    WBC 11.3 (*)    RDW 16.4 (*)    All other components within normal limits  URINALYSIS, ROUTINE W REFLEX MICROSCOPIC - Abnormal; Notable for the following:    APPearance HAZY (*)    Hgb urine dipstick LARGE (*)    Protein, ur 100 (*)    Leukocytes, UA SMALL (*)    Bacteria, UA FEW (*)    Squamous Epithelial / LPF 0-5 (*)    All other components within normal limits  LIPASE, BLOOD    EKG  EKG Interpretation None  Radiology [place] Renal  Result Date: 08/24/2016 CLINICAL DATA:  Right-sided flank pain, possible hydronephrosis, EXAM: RENAL / URINARY TRACT ULTRASOUND COMPLETE COMPARISON:  CT abdomen pelvis of 08/01/2010 FINDINGS: Right Kidney: Length: 13.0 cm. There is moderate right hydronephrosis present. The right ureter also appears dilated. The renal parenchyma may be minimally echogenic. Left Kidney: Length: 11.6 cm. No left hydronephrosis is seen. No echogenic foci are noted. Bladder: The urinary bladder is largely decompressed. The distal left ureter is not dilated. However the right ureter is dilated and there does appear to be a 1.2 cm echogenic focus within the distal right ureter is suspicious for distal right ureteral calculus. IMPRESSION: 1. Moderate right hydronephrosis and hydroureter apparently due to a distal right ureteral calculus of 1.2 cm. 2. No left hydronephrosis. 3. Question of slightly echogenic right renal parenchyma. Electronically Signed   By: Dwyane DeePaul  Barry M.D.   On: 08/24/2016 10:28    Procedures Procedures (including critical care time)  Medications Ordered in ED Medications  ondansetron (ZOFRAN-ODT) disintegrating tablet 4 mg (4 mg Oral Given 08/24/16 0752)  ketorolac (TORADOL) 15 MG/ML injection 15 mg (15 mg Intravenous Given 08/24/16 0935)  HYDROmorphone (DILAUDID) injection 0.5 mg (0.5 mg Intravenous  Given 08/24/16 1052)  ondansetron (ZOFRAN) injection 4 mg (4 mg Intravenous Given 08/24/16 1053)     Initial Impression / Assessment and Plan / ED Course  I have reviewed the triage vital signs and the nursing notes.  Pertinent labs & imaging results that were available during my care of the patient were reviewed by me and considered in my medical decision making (see chart for details).  Clinical Course as of Aug 25 1139  Thu Aug 24, 2016  1140 Results from the ER workup discussed with the patient face to face and all questions answered to the best of my ability.  Pt's pain is in control. She will get meds for her stones and f/u info. Pt reaffirmed that she has no dysuria typicall, so we will give her antibiotics.  Strict ER return precautions have been discussed, and patient is agreeing with the plan and is comfortable with the workup done and the recommendations from the ER.  US Renal [AN]    Clinical Course User Index [AN] Derwood KaplanNanavati, Kabrea Seeney, MD     Final Clinical Impressions(s) / ED Diagnoses   Final diagnoses:  Ureteral colic  Ureteral stone with hydronephrosis  Right flank pain  Dehydration  Acute cystitis with hematuria   Pt comes in with cc of flank pain. Concerns for ureteral colic. Pt has dysuria - which is new - so infected stone also possible. However, I am not concerned about pt being septic. She is a young woman, with hx of multiple stones, and so we will get US rather than CT. UA  Ordered to screen for infection.  New Prescriptions New Prescriptions   HYDROCODONE-ACETAMINOPHEN (NORCO/VICODIN) 5-325 MG TABLET    Take 1 tablet by mouth every 12 (twelve) hours as needed for severe pain.   IBUPROFEN (ADVIL,MOTRIN) 600 MG TABLET    Take 1 tablet (600 mg total) by mouth every 6 (six) hours as needed.   NITROFURANTOIN, MACROCRYSTAL-MONOHYDRATE, (MACROBID) 100 MG CAPSULE    Take 1 capsule (100 mg total) by mouth 2 (two) times daily.   ONDANSETRON (ZOFRAN ODT) 8 MG  DISINTEGRATING TABLET    Take 1 tablet (8 mg total) by mouth every 8 (eight) hours as needed for nausea.   TAMSULOSIN (FLOMAX) 0.4 MG CAPS CAPSULE    Take  1 capsule (0.4 mg total) by mouth daily.     Derwood KaplanNanavati, Renetta Suman, MD 08/24/16 1008    Derwood KaplanNanavati, Tedrick Port, MD 08/24/16 1141

## 2016-08-24 NOTE — Discharge Instructions (Signed)
We saw you in the ER for the right sided pain, Our results indicate that you have a kidney stone. We were able to get your pain is relative control, and we can safely send you home.  Take the meds prescribed. Set up an appointment with the Urologist. If the pain is unbearable, you start having fevers, chills, and are unable to keep any meds down - then return to the ER.

## 2016-09-20 ENCOUNTER — Other Ambulatory Visit: Payer: Self-pay | Admitting: Urology

## 2016-09-20 ENCOUNTER — Encounter (HOSPITAL_BASED_OUTPATIENT_CLINIC_OR_DEPARTMENT_OTHER): Payer: Self-pay | Admitting: *Deleted

## 2016-09-20 NOTE — Progress Notes (Signed)
NPO AFTER MN.  ARRIVE AT 0715.  NEEDS URINE PREG.  CURRENT LAB RESULTS IN CHART AND EPIC.  MAY TAKE HYDROCODONE OR TYLENOL IF NEEDED AM DOS W/ SIPS OF WATER.

## 2016-09-21 ENCOUNTER — Encounter (HOSPITAL_BASED_OUTPATIENT_CLINIC_OR_DEPARTMENT_OTHER): Payer: Self-pay

## 2016-09-21 ENCOUNTER — Ambulatory Visit (HOSPITAL_BASED_OUTPATIENT_CLINIC_OR_DEPARTMENT_OTHER): Payer: 59 | Admitting: Anesthesiology

## 2016-09-21 ENCOUNTER — Ambulatory Visit (HOSPITAL_BASED_OUTPATIENT_CLINIC_OR_DEPARTMENT_OTHER)
Admission: RE | Admit: 2016-09-21 | Discharge: 2016-09-21 | Disposition: A | Discharge status: Home / Self Care | Payer: 59 | Source: Ambulatory Visit | Attending: Urology | Admitting: Urology

## 2016-09-21 ENCOUNTER — Encounter (HOSPITAL_BASED_OUTPATIENT_CLINIC_OR_DEPARTMENT_OTHER)
Admission: RE | Disposition: A | Discharge status: Home / Self Care | Payer: Self-pay | Source: Ambulatory Visit | Attending: Urology

## 2016-09-21 DIAGNOSIS — K219 Gastro-esophageal reflux disease without esophagitis: Secondary | ICD-10-CM | POA: Diagnosis not present

## 2016-09-21 DIAGNOSIS — N201 Calculus of ureter: Secondary | ICD-10-CM | POA: Insufficient documentation

## 2016-09-21 DIAGNOSIS — Z87442 Personal history of urinary calculi: Secondary | ICD-10-CM | POA: Insufficient documentation

## 2016-09-21 DIAGNOSIS — Z87891 Personal history of nicotine dependence: Secondary | ICD-10-CM | POA: Insufficient documentation

## 2016-09-21 HISTORY — DX: Urgency of urination: R39.15

## 2016-09-21 HISTORY — PX: HOLMIUM LASER APPLICATION: SHX5852

## 2016-09-21 HISTORY — DX: Gastro-esophageal reflux disease without esophagitis: K21.9

## 2016-09-21 HISTORY — PX: CYSTOSCOPY WITH RETROGRADE PYELOGRAM, URETEROSCOPY AND STENT PLACEMENT: SHX5789

## 2016-09-21 HISTORY — DX: Calculus of ureter: N20.1

## 2016-09-21 LAB — POCT PREGNANCY, URINE: PREG TEST UR: NEGATIVE

## 2016-09-21 SURGERY — CYSTOURETEROSCOPY, WITH RETROGRADE PYELOGRAM AND STENT INSERTION
Anesthesia: General | Site: Renal | Laterality: Right

## 2016-09-21 MED ORDER — IOHEXOL 300 MG/ML  SOLN
INTRAMUSCULAR | Status: DC | PRN
  Administered 2016-09-21: 20 mL

## 2016-09-21 MED ORDER — MIDAZOLAM HCL 2 MG/2ML IJ SOLN
INTRAMUSCULAR | Status: AC
  Filled 2016-09-21: qty 2

## 2016-09-21 MED ORDER — ONDANSETRON HCL 4 MG/2ML IJ SOLN
INTRAMUSCULAR | Status: AC
  Filled 2016-09-21: qty 2

## 2016-09-21 MED ORDER — LIDOCAINE HCL 2 % EX GEL
CUTANEOUS | Status: DC | PRN
Start: 2016-09-21 — End: 2016-09-21
  Administered 2016-09-21: 1 via URETHRAL

## 2016-09-21 MED ORDER — DEXAMETHASONE SODIUM PHOSPHATE 4 MG/ML IJ SOLN
INTRAMUSCULAR | Status: DC | PRN
  Administered 2016-09-21: 10 mg via INTRAVENOUS

## 2016-09-21 MED ORDER — PROPOFOL 10 MG/ML IV BOLUS
INTRAVENOUS | Status: AC
  Filled 2016-09-21: qty 40

## 2016-09-21 MED ORDER — PROMETHAZINE HCL 25 MG/ML IJ SOLN
INTRAMUSCULAR | Status: AC
  Filled 2016-09-21: qty 1

## 2016-09-21 MED ORDER — ONDANSETRON HCL 4 MG/2ML IJ SOLN
INTRAMUSCULAR | Status: DC | PRN
  Administered 2016-09-21: 4 mg via INTRAVENOUS

## 2016-09-21 MED ORDER — SCOPOLAMINE 1 MG/3DAYS TD PT72
MEDICATED_PATCH | TRANSDERMAL | Status: AC
  Filled 2016-09-21: qty 1

## 2016-09-21 MED ORDER — KETOROLAC TROMETHAMINE 30 MG/ML IJ SOLN
INTRAMUSCULAR | Status: DC | PRN
  Administered 2016-09-21: 30 mg via INTRAVENOUS

## 2016-09-21 MED ORDER — SCOPOLAMINE 1 MG/3DAYS TD PT72
MEDICATED_PATCH | TRANSDERMAL | Status: DC | PRN
  Administered 2016-09-21: 1 via TRANSDERMAL

## 2016-09-21 MED ORDER — KETOROLAC TROMETHAMINE 30 MG/ML IJ SOLN
INTRAMUSCULAR | Status: AC
  Filled 2016-09-21: qty 1

## 2016-09-21 MED ORDER — FENTANYL CITRATE (PF) 100 MCG/2ML IJ SOLN
INTRAMUSCULAR | Status: AC
  Filled 2016-09-21: qty 2

## 2016-09-21 MED ORDER — LACTATED RINGERS IV SOLN
INTRAVENOUS | Status: DC
  Administered 2016-09-21: 1000 mL via INTRAVENOUS
  Administered 2016-09-21: 08:00:00 via INTRAVENOUS
  Filled 2016-09-21: qty 1000

## 2016-09-21 MED ORDER — HYDROMORPHONE HCL 1 MG/ML IJ SOLN
0.2500 mg | INTRAMUSCULAR | Status: DC | PRN
  Administered 2016-09-21: 0.25 mg via INTRAVENOUS
  Filled 2016-09-21: qty 0.5

## 2016-09-21 MED ORDER — CIPROFLOXACIN HCL 500 MG PO TABS: 500.0000 mg | ORAL_TABLET | Freq: Once | ORAL | 0 refills | Status: AC

## 2016-09-21 MED ORDER — BELLADONNA ALKALOIDS-OPIUM 16.2-60 MG RE SUPP
RECTAL | Status: DC | PRN
  Administered 2016-09-21: 1 via RECTAL

## 2016-09-21 MED ORDER — PROPOFOL 10 MG/ML IV BOLUS
INTRAVENOUS | Status: DC | PRN
  Administered 2016-09-21: 200 mg via INTRAVENOUS

## 2016-09-21 MED ORDER — TRAMADOL HCL 50 MG PO TABS: 50.0000 mg | ORAL_TABLET | Freq: Four times a day (QID) | ORAL | 0 refills | Status: AC | PRN

## 2016-09-21 MED ORDER — CIPROFLOXACIN IN D5W 400 MG/200ML IV SOLN
INTRAVENOUS | Status: AC
  Filled 2016-09-21: qty 200

## 2016-09-21 MED ORDER — EPHEDRINE 5 MG/ML INJ
INTRAVENOUS | Status: AC
  Filled 2016-09-21: qty 10

## 2016-09-21 MED ORDER — CIPROFLOXACIN IN D5W 400 MG/200ML IV SOLN
400.0000 mg | INTRAVENOUS | Status: AC
  Administered 2016-09-21: 400 mg via INTRAVENOUS
  Filled 2016-09-21: qty 200

## 2016-09-21 MED ORDER — FENTANYL CITRATE (PF) 100 MCG/2ML IJ SOLN
INTRAMUSCULAR | Status: DC | PRN
  Administered 2016-09-21 (×2): 25 ug via INTRAVENOUS
  Administered 2016-09-21: 50 ug via INTRAVENOUS

## 2016-09-21 MED ORDER — DEXAMETHASONE SODIUM PHOSPHATE 10 MG/ML IJ SOLN
INTRAMUSCULAR | Status: AC
  Filled 2016-09-21: qty 1

## 2016-09-21 MED ORDER — SODIUM CHLORIDE 0.9 % IR SOLN
Status: DC | PRN
  Administered 2016-09-21: 3000 mL
  Administered 2016-09-21: 1000 mL

## 2016-09-21 MED ORDER — HYDROMORPHONE HCL-NACL 0.5-0.9 MG/ML-% IV SOSY
PREFILLED_SYRINGE | INTRAVENOUS | Status: AC
  Filled 2016-09-21: qty 1

## 2016-09-21 MED ORDER — LIDOCAINE 2% (20 MG/ML) 5 ML SYRINGE
INTRAMUSCULAR | Status: AC
  Filled 2016-09-21: qty 5

## 2016-09-21 MED ORDER — LIDOCAINE HCL (CARDIAC) 20 MG/ML IV SOLN
INTRAVENOUS | Status: DC | PRN
  Administered 2016-09-21: 100 mg via INTRAVENOUS

## 2016-09-21 MED ORDER — MIDAZOLAM HCL 5 MG/5ML IJ SOLN
INTRAMUSCULAR | Status: DC | PRN
  Administered 2016-09-21: 2 mg via INTRAVENOUS

## 2016-09-21 MED ORDER — BELLADONNA ALKALOIDS-OPIUM 16.2-60 MG RE SUPP
RECTAL | Status: AC
  Filled 2016-09-21: qty 1

## 2016-09-21 MED ORDER — PROMETHAZINE HCL 25 MG/ML IJ SOLN
6.2500 mg | INTRAMUSCULAR | Status: DC | PRN
  Administered 2016-09-21: 6.25 mg via INTRAVENOUS
  Filled 2016-09-21: qty 1

## 2016-09-21 SURGICAL SUPPLY — 34 items
BAG DRAIN URO-CYSTO SKYTR STRL (DRAIN) ×3 IMPLANT
BAG DRN UROCATH (DRAIN) ×1
BASKET DAKOTA 1.9FR 11X120 (BASKET) IMPLANT
BASKET LASER NITINOL 1.9FR (BASKET) IMPLANT
BASKET STNLS GEMINI 4WIRE 3FR (BASKET) IMPLANT
BASKET STONE 1.7 NGAGE (UROLOGICAL SUPPLIES) ×2 IMPLANT
BASKET ZERO TIP NITINOL 2.4FR (BASKET) IMPLANT
BSKT STON RTRVL 120 1.9FR (BASKET)
BSKT STON RTRVL GEM 120X11 3FR (BASKET)
BSKT STON RTRVL ZERO TP 2.4FR (BASKET)
CATH URET 5FR 28IN OPEN ENDED (CATHETERS) ×3 IMPLANT
CATH URET DUAL LUMEN 6-10FR 50 (CATHETERS) IMPLANT
CLOTH BEACON ORANGE TIMEOUT ST (SAFETY) ×3 IMPLANT
FIBER LASER FLEXIVA 365 (UROLOGICAL SUPPLIES) ×2 IMPLANT
FIBER LASER TRAC TIP (UROLOGICAL SUPPLIES) IMPLANT
GLOVE BIO SURGEON STRL SZ7.5 (GLOVE) ×3 IMPLANT
GOWN STRL REUS W/TWL XL LVL3 (GOWN DISPOSABLE) ×5 IMPLANT
GUIDEWIRE 0.038 PTFE COATED (WIRE) IMPLANT
GUIDEWIRE ANG ZIPWIRE 038X150 (WIRE) IMPLANT
GUIDEWIRE STR DUAL SENSOR (WIRE) ×3 IMPLANT
INFUSOR MANOMETER BAG 3000ML (MISCELLANEOUS) ×3 IMPLANT
IV NS IRRIG 3000ML ARTHROMATIC (IV SOLUTION) ×4 IMPLANT
KIT BALLIN UROMAX 15FX10 (LABEL) IMPLANT
KIT BALLN UROMAX 15FX4 (MISCELLANEOUS) IMPLANT
KIT BALLN UROMAX 26 75X4 (MISCELLANEOUS)
KIT RM TURNOVER CYSTO AR (KITS) ×3 IMPLANT
MANIFOLD NEPTUNE II (INSTRUMENTS) ×2 IMPLANT
NS IRRIG 500ML POUR BTL (IV SOLUTION) IMPLANT
PACK CYSTO (CUSTOM PROCEDURE TRAY) ×3 IMPLANT
SET HIGH PRES BAL DIL (LABEL)
SHEATH ACCESS URETERAL 38CM (SHEATH) IMPLANT
STENT URET 6FRX24 CONTOUR (STENTS) ×2 IMPLANT
TUBE CONNECTING 12'X1/4 (SUCTIONS)
TUBE CONNECTING 12X1/4 (SUCTIONS) IMPLANT

## 2016-09-21 NOTE — Transfer of Care (Signed)
Last Vitals:  Vitals:   09/21/16 0928 09/21/16 0930  BP: (!) 133/91 123/82  Pulse: 84 80  Resp: (!) 24 13  Temp: 36.5 C   SpO2: 97% 98%    Last Pain:  Vitals:   09/21/16 0731  TempSrc:   PainSc: 6       Patients Stated Pain Goal: 3 (09/21/16 0731)  Immediate Anesthesia Transfer of Care Note  Patient: Lori [name]  Procedure(s) Performed: Procedure(s) (LRB): CYSTOSCOPY WITH RIGHT RETROGRADE PYELOGRAM,RIGHT  URETEROSCOPY WITH HOLMIUM LASER LITHOTRIPSY STONE EXTRACTION AND RIGHT STENT PLACEMENT (Right) HOLMIUM LASER APPLICATION (Right)  Patient Location: PACU  Anesthesia Type: General  Level of Consciousness: awake, alert  and oriented  Airway & Oxygen Therapy: Patient Spontanous Breathing and Patient connected to nasal cannula oxygen  Post-op Assessment: Report given to PACU RN and Post -op Vital signs reviewed and stable  Post vital signs: Reviewed and stable  Complications: No apparent anesthesia complications

## 2016-09-21 NOTE — H&P (Signed)
Lori [name] is a 31 year-old female patient who is here for renal calculi.  The problem is on the right side. She first stated noticing pain on approximately 12/24/2015. This is not her first kidney stone. She has had 5 stones prior to getting this one. She is currently having flank pain and back pain. She denies having groin pain, nausea, vomiting, fever, and chills. She has not caught a stone in her urine strainer since her symptoms began.   She has never had surgical treatment for calculi in the past.   31 year old female with prior history of recurrent urolithiasis (has never had a metabolic evaluation) comes in today with a several month history of intermittent right flank pain, hematuria, with recent renal ultrasound showing a probable right distal ureteral stone 12 mm in size with associated right hydroureteronephrosis. She denies fever, chills, she does have intermittent hematuria and intermittent nausea and vomiting.     ALLERGIES: Augmentin penicillin     MEDICATIONS: Hydrocodone-Acetaminophen prn pain  Ibuprofen 800 mg tablet prn  Mirena  Xyzal 5 mg tablet     GU PSH: None   NON-GU PSH: Cholecystectomy (laparoscopic) C-Section - 03/08/2016    GU PMH: None   NON-GU PMH: Anxiety GERD    FAMILY HISTORY: 1 Daughter - Daughter 1 son - Son   SOCIAL HISTORY: Marital Status: Married Ethnicity: Not Hispanic Or Latino; Race: White Current Smoking Status: Patient does not smoke anymore. Has not smoked since 08/24/2011.   Tobacco Use Assessment Completed: Used Tobacco in last 30 days? Drinks 2 caffeinated drinks per day. Patient's occupation Actuary.    REVIEW OF SYSTEMS:    GU Review Female:   Patient reports frequent urination, burning /pain with urination, get up at night to urinate, leakage of urine, stream starts and stops, trouble starting your stream, and have to strain to urinate. Patient denies hard to postpone urination and being pregnant.   Gastrointestinal (Upper):   Patient reports nausea, vomiting, and indigestion/ heartburn.   Gastrointestinal (Lower):   Patient denies diarrhea and constipation.  Constitutional:   Patient reports fatigue. Patient denies fever, night sweats, and weight loss.  Skin:   Patient denies skin rash/ lesion and itching.  Eyes:   Patient denies blurred vision and double vision.  Ears/ Nose/ Throat:   Patient reports sinus problems. Patient denies sore throat.  Hematologic/Lymphatic:   Patient reports easy bruising. Patient denies swollen glands.  Cardiovascular:   Patient denies leg swelling and chest pains.  Respiratory:   Patient denies cough and shortness of breath.  Endocrine:   Patient denies excessive thirst.  Musculoskeletal:   Patient denies back pain and joint pain.  Neurological:   Patient reports headaches. Patient denies dizziness.  Psychologic:   Patient reports anxiety. Patient denies depression.   VITAL SIGNS:      09/20/2016 12:37 PM  Weight 185 lb / 83.91 kg  Height 64 in / 162.56 cm  BP 107/74 mmHg  Pulse 66 /min  Temperature 98.1 F / 36.7 C  BMI 31.8 kg/m   MULTI-SYSTEM PHYSICAL EXAMINATION:    Constitutional: Well-nourished. No physical deformities. Normally developed. Good grooming.  Neck: Neck symmetrical, not swollen. Normal tracheal position.  Respiratory: No labored breathing, no use of accessory muscles.   Skin: No paleness, no jaundice, no cyanosis. No lesion, no ulcer, no rash.  Neurologic / Psychiatric: Oriented to time, oriented to place, oriented to person. No depression, no anxiety, no agitation.  Gastrointestinal: Obese abdomen. No mass, no tenderness,  no rigidity.   Eyes: Normal conjunctivae. Normal eyelids.  Ears, Nose, Mouth, and Throat: Left ear no scars, no lesions, no masses. Right ear no scars, no lesions, no masses. Nose no scars, no lesions, no masses. Normal hearing. Normal lips.  Musculoskeletal: Normal gait and station of head and neck.      PAST DATA REVIEWED:  Source Of History:  Patient  Records Review:   Previous Patient Records  X-Ray Review: KUB: Reviewed Films. Reviewed Report. Discussed With Patient. KUB from 06/02/2016 was reviewed. It does look like she has a right pelvic calcification consistent with a possible stone. IUD is noted as well. C.T. Stone Protocol: Reviewed Films. Discussed With Patient. Images from 2012 CT scan were reviewed. She has mild papillary calcifications but no significant stones at that time.    PROCEDURES:         KUB - [id number]  A single view of the abdomen is obtained. Calcific density now in the right lower pelvis consistent with right distal ureteral stone. IUD within the pelvis. No calcifications seen overlying either renal contour. Bony structures are normal. Metallic densities in the right upper quadrant consistent with prior cholecystectomy.         ASSESSMENT:      ICD-10 Details  1 GU:   Ureteral calculus - N20.1 Right, Large right ureterovesical junction stone. More than likely, this has been progressing for quite some time, and initially symptomatic 9 months ago. On recent ultrasound she had significant right hydronephrosis. She has persistent/slightly worsening pain.   PLAN:           Orders X-Rays: KUB          Schedule         Document Letter(s):  Created for Patient: Clinical Summary         Notes:   I had a long discussion with the patient and her mother regarding treatment. As this stone has been present for many months, I think it necessary to proceed with management. We discussed lithotripsy using shockwave technology versus ureteroscopy/holmium laser lithotripsy/extraction/possible stone. Stone free rates of each of these were discussed, use of general versus sedation were discussed, and I would recommend at this point, as this is a distal stone near the UVJ, ureteroscopy. She agrees with this. Because of insurance issues, she would like to do this soon. We will see  about having one of my partners perform this either tomorrow or Friday.

## 2016-09-21 NOTE — Anesthesia Postprocedure Evaluation (Signed)
Anesthesia Post Note  Patient: Lori [name]  Procedure(s) Performed: Procedure(s) (LRB): CYSTOSCOPY WITH RIGHT RETROGRADE PYELOGRAM,RIGHT  URETEROSCOPY WITH HOLMIUM LASER LITHOTRIPSY STONE EXTRACTION AND RIGHT STENT PLACEMENT (Right) HOLMIUM LASER APPLICATION (Right)     Patient location during evaluation: PACU Anesthesia Type: General Level of consciousness: awake and sedated Pain management: pain level controlled Vital Signs Assessment: post-procedure vital signs reviewed and stable Respiratory status: spontaneous breathing, nonlabored ventilation, respiratory function stable and patient connected to nasal cannula oxygen Cardiovascular status: blood pressure returned to baseline and stable Postop Assessment: no signs of nausea or vomiting Anesthetic complications: no    Last Vitals:  Vitals:   09/21/16 1145 09/21/16 1220  BP:  108/70  Pulse: (!) 57 (!) 52  Resp: 14 14  Temp:  37.3 C  SpO2: 97% 99%    Last Pain:  Vitals:   09/21/16 1100  TempSrc:   PainSc: Asleep                 Yazir Koerber,JAMES TERRILL

## 2016-09-21 NOTE — Anesthesia Procedure Notes (Signed)
Procedure Name: LMA Insertion Date/Time: 09/21/2016 8:45 AM Performed by: Sharee HolsterMASSAGEE, TERRY Pre-anesthesia Checklist: Patient identified, Emergency Drugs available, Suction available and Patient being monitored Patient Re-evaluated:Patient Re-evaluated prior to induction Oxygen Delivery Method: Circle system utilized Preoxygenation: Pre-oxygenation with 100% oxygen Induction Type: IV induction Ventilation: Mask ventilation without difficulty LMA: LMA inserted LMA Size: 4.0 Number of attempts: 1 Airway Equipment and Method: Bite block Placement Confirmation: positive ETCO2 Tube secured with: Tape Dental Injury: Teeth and Oropharynx as per pre-operative assessment

## 2016-09-21 NOTE — Anesthesia Preprocedure Evaluation (Addendum)
Anesthesia Evaluation  Patient identified by MRN, date of birth, ID band Patient awake    Reviewed: Allergy & Precautions, NPO status , Patient's Chart, lab work & pertinent test results  Airway Mallampati: I   Neck ROM: Full    Dental  (+) Teeth Intact, Dental Advisory Given,    Pulmonary neg pulmonary ROS, former smoker,    breath sounds clear to auscultation       Cardiovascular negative cardio ROS   Rhythm:Regular Rate:Normal     Neuro/Psych negative neurological ROS  negative psych ROS   GI/Hepatic Neg liver ROS, GERD  ,  Endo/Other  negative endocrine ROS  Renal/GU negative Renal ROS  negative genitourinary   Musculoskeletal negative musculoskeletal ROS (+)   Abdominal   Peds negative pediatric ROS (+)  Hematology negative hematology ROS (+)   Anesthesia Other Findings   Reproductive/Obstetrics negative OB ROS                            Anesthesia Physical Anesthesia Plan  ASA: I  Anesthesia Plan: General   Post-op Pain Management:    Induction: Intravenous  PONV Risk Score and Plan: 4 or greater and Ondansetron, Dexamethasone, Midazolam, Scopolamine patch - Pre-op, Propofol infusion and Treatment may vary due to age or medical condition  Airway Management Planned: LMA  Additional Equipment:   Intra-op Plan:   Post-operative Plan: Extubation in OR  Informed Consent: I have reviewed the patients History and Physical, chart, labs and discussed the procedure including the risks, benefits and alternatives for the proposed anesthesia with the patient or authorized representative who has indicated his/her understanding and acceptance.   Dental advisory given  Plan Discussed with: CRNA  Anesthesia Plan Comments:         Anesthesia Quick Evaluation

## 2016-09-21 NOTE — Discharge Instructions (Signed)
DISCHARGE INSTRUCTIONS FOR KIDNEY STONE/URETERAL STENT   MEDICATIONS:  1.  Resume all your other meds from home - except do not take any extra narcotic pain meds that you may have at home.  2.  AZO, over the counter,  is to help with the burning/stinging when you urinate. 3. Tramadol is for moderate/severe pain, otherwise taking upto 1000 mg every 6 hours of plainTylenol will help treat your pain.   4. Take Cipro one hour prior to removal of your stent.   ACTIVITY:  1. No strenuous activity x 1week  2. No driving while on narcotic pain medications  3. Drink plenty of water  4. Continue to walk at home - you can still get blood clots when you are at home, so keep active, but don't over do it.  5. May return to work/school tomorrow or when you feel ready   BATHING:  1. You can shower and we recommend daily showers  2. You have a string coming from your urethra: The stent string is attached to your ureteral stent. Do not pull on this.   SIGNS/SYMPTOMS TO CALL:  Please call us if you have a fever greater than 101.5, uncontrolled nausea/vomiting, uncontrolled pain, dizziness, unable to urinate, bloody urine, chest pain, shortness of breath, leg swelling, leg pain, redness around wound, drainage from wound, or any other concerns or questions.   You can reach us at [phone].   FOLLOW-UP:  1. You have an appointment in 6 weeks with a ultrasound of your kidneys prior.   2. You have a string attached to your stent, you may remove it on Sept 4th. To do this, pull the strings until the stents are completely removed. You may feel an odd sensation in your back.    Post Anesthesia Home Care Instructions  Activity: Get plenty of rest for the remainder of the day. A responsible individual must stay with you for 24 hours following the procedure.  For the next 24 hours, DO NOT: -Drive a car -Advertising copywriterperate machinery -Drink alcoholic beverages -Take any medication unless instructed by your  physician -Make any legal decisions or sign important papers.  Meals: Start with liquid foods such as gelatin or soup. Progress to regular foods as tolerated. Avoid greasy, spicy, heavy foods. If nausea and/or vomiting occur, drink only clear liquids until the nausea and/or vomiting subsides. Call your physician if vomiting continues.  Special Instructions/Symptoms: Your throat may feel dry or sore from the anesthesia or the breathing tube placed in your throat during surgery. If this causes discomfort, gargle with warm salt water. The discomfort should disappear within 24 hours.  If you had a scopolamine patch placed behind your ear for the management of post- operative nausea and/or vomiting:  1. The medication in the patch is effective for 72 hours, after which it should be removed.  Wrap patch in a tissue and discard in the trash. Wash hands thoroughly with soap and water. 2. You may remove the patch earlier than 72 hours if you experience unpleasant side effects which may include dry mouth, dizziness or visual disturbances. 3. Avoid touching the patch. Wash your hands with soap and water after contact with the patch.

## 2016-09-21 NOTE — Op Note (Signed)
Preoperative diagnosis: right ureteral calculus  Postoperative diagnosis: right ureteral calculus  Procedure:  1. Cystoscopy 2. right ureteroscopy and stone removal 3. Ureteroscopic laser lithotripsy 4. right 60F x 24cm ureteral stent placement  5. right retrograde pyelography with interpretation  Surgeon: Crist FatBenjamin W. Angelette Ganus, MD  Anesthesia: General  Complications: None  Intraoperative findings: right retrograde pyelography demonstrated a filling defect within the right ureter consistent with the patient's known calculus and severe hydroureteronephrosis.  EBL: Minimal  Specimens: 1. right ureteral calculus  Disposition of specimens: [facility] for stone analysis  Indication: Lori [name] is a 31 y.o.   patient with a right ureteral stone and associated right symptoms. After reviewing the management options for treatment, the patient elected to proceed with the above surgical procedure(s). We have discussed the potential benefits and risks of the procedure, side effects of the proposed treatment, the likelihood of the patient achieving the goals of the procedure, and any potential problems that might occur during the procedure or recuperation. Informed consent has been obtained.   Description of procedure:  The patient was taken to the operating room and general anesthesia was induced.  The patient was placed in the dorsal lithotomy position, prepped and draped in the usual sterile fashion, and preoperative antibiotics were administered. A preoperative time-out was performed.   Cystourethroscopy was performed.  The patient's urethra was examined and was normal. The bladder was then systematically examined in its entirety. There was no evidence for any bladder tumors, stones, or other mucosal pathology.    Attention then turned to the right ureteral orifice and a ureteral catheter was used to intubate the ureteral orifice.  Omnipaque contrast was injected  through the ureteral catheter and a retrograde pyelogram was performed with findings as dictated above.  A 0.38 sensor guidewire was then advanced up the right ureter into the renal pelvis under fluoroscopic guidance. The 6 Fr semirigid ureteroscope was then advanced into the ureter next to the guidewire and the calculus was identified.   The stone was then fragmented with the 365 micron holmium laser fiber on a setting of 0.6 and frequency of 6 Hz.   All stones were then removed from the ureter with an N-gage nitinol basket.  Reinspection of the ureter revealed no remaining visible stones or fragments.   The wire was then backloaded through the cystoscope and a ureteral stent was advance over the wire using Seldinger technique.  The stent was positioned appropriately under fluoroscopic and cystoscopic guidance.  The wire was then removed with an adequate stent curl noted in the renal pelvis as well as in the bladder.  The bladder was then emptied and the procedure ended.  The patient appeared to tolerate the procedure well and without complications.  The patient was able to be awakened and transferred to the recovery unit in satisfactory condition.   Disposition: The tether of the stent was left on and tucked inside the patient's vagina.  Instructions for removing the stent have been provided to the patient. The patient has been scheduled for followup in 6 weeks with a renal ultrasound.

## 2016-09-21 NOTE — Interval H&P Note (Signed)
History and Physical Interval Note:  09/21/2016 8:11 AM  Lori [name]  has presented today for surgery, with the diagnosis of right distal ureteral stone  The various methods of treatment have been discussed with the patient and family. After consideration of risks, benefits and other options for treatment, the patient has consented to  Procedure(s): CYSTOSCOPY WITH RIGHT RETROGRADE PYELOGRAM,RIGHT  URETEROSCOPY WITH HOLMIUM LASER LITHOTRIPSY STONE EXTRACTION AND RIGHT STENT PLACEMENT (Right) HOLMIUM LASER APPLICATION (Right) as a surgical intervention .  The patient's history has been reviewed, patient examined, no change in status, stable for surgery.  I have reviewed the patient's chart and labs.  Questions were answered to the patient's satisfaction.     Berniece SalinesHERRICK, Bryssa Tones W

## 2016-09-22 ENCOUNTER — Encounter (HOSPITAL_BASED_OUTPATIENT_CLINIC_OR_DEPARTMENT_OTHER): Payer: Self-pay | Admitting: Urology

## 2018-01-11 IMAGING — US US RENAL
1 series · 14 of 25 positions shown · non-contrast
Comparison: CT abdomen pelvis of 08/01/2010

CLINICAL DATA: Right-sided flank pain, possible hydronephrosis,

EXAM:
RENAL / URINARY TRACT ULTRASOUND COMPLETE

[Series 1: us renal · 0.28mm/px · 47 acquisitions, 14 frames shown]
[im 1/47]
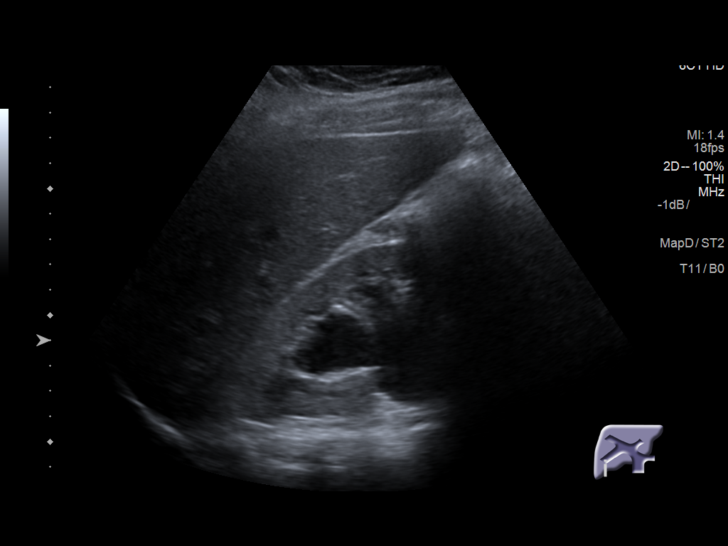
[im 4/47]
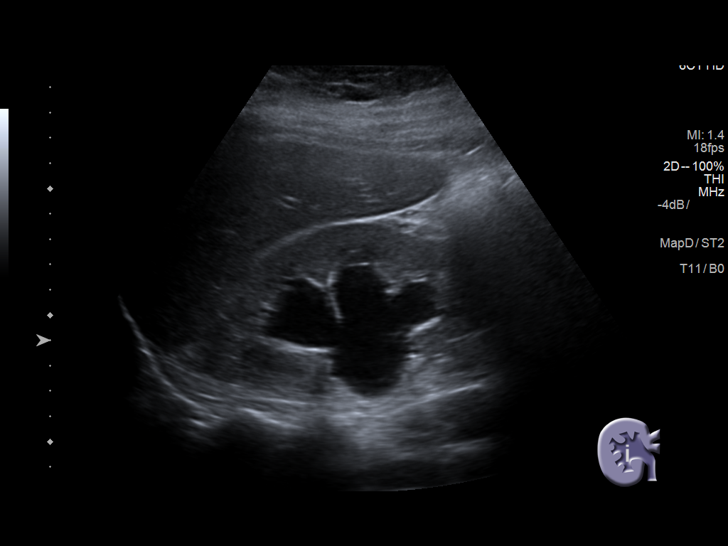
[im 8/47]
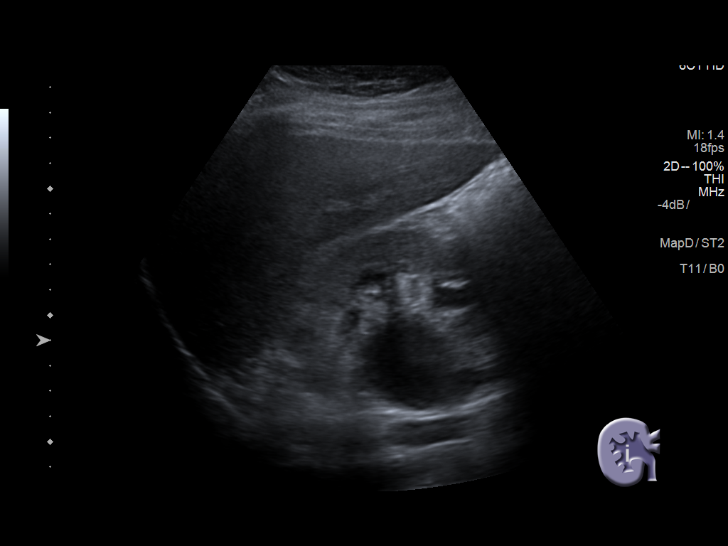
[im 12/47]
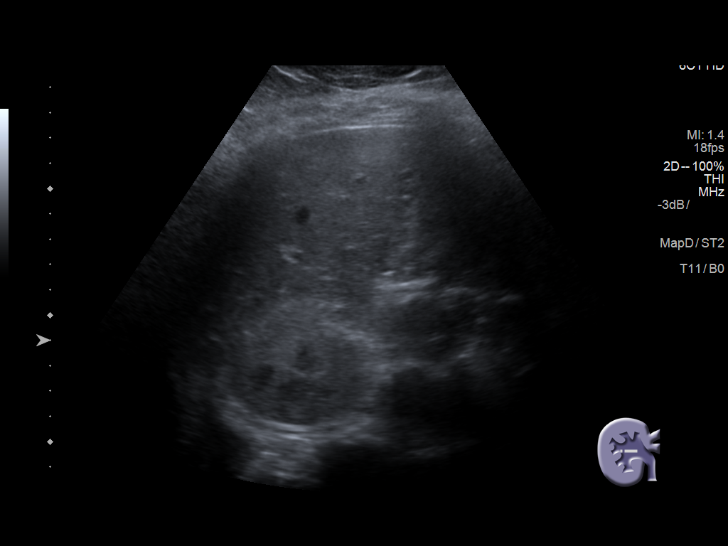
[im 16/47]
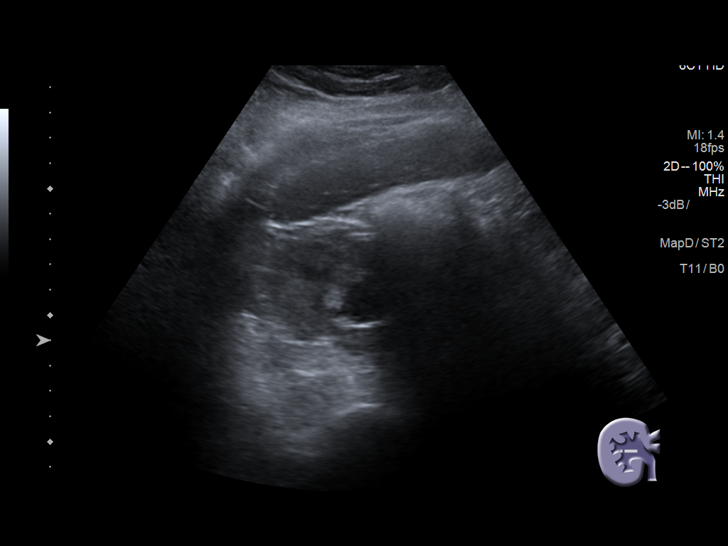
[im 18/47]
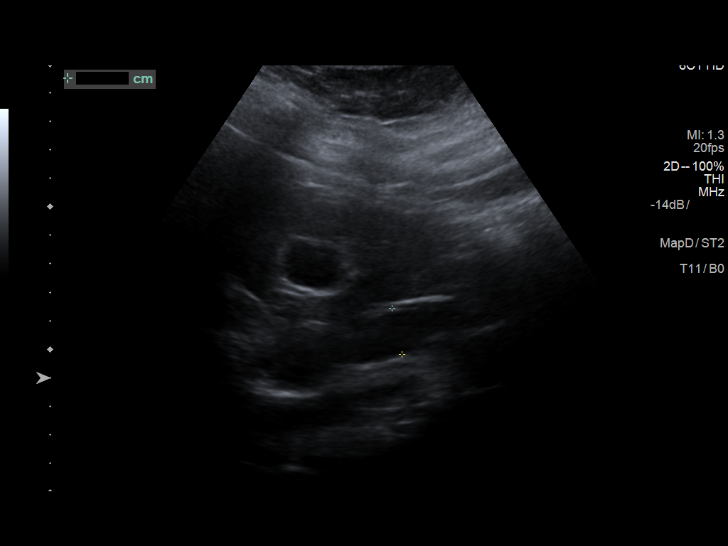
[im 22/47]
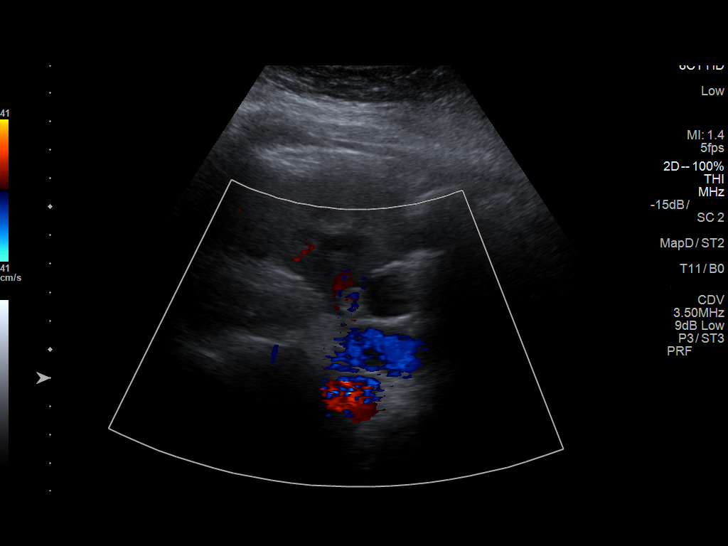
[im 25/47]
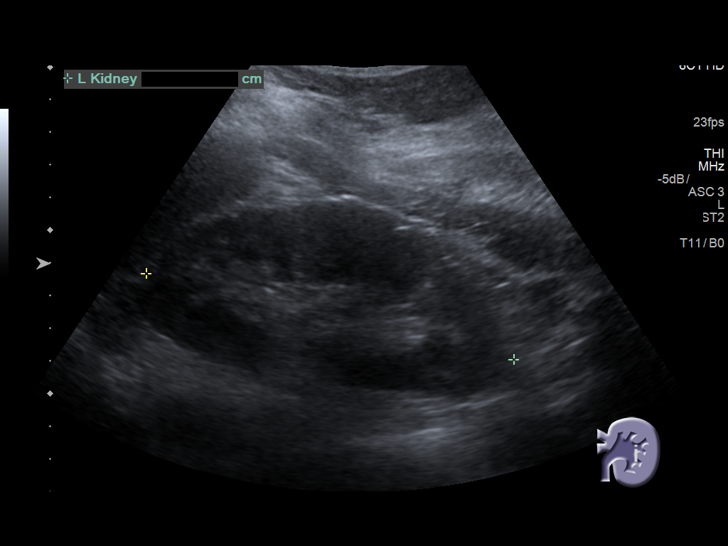
[im 29/47]
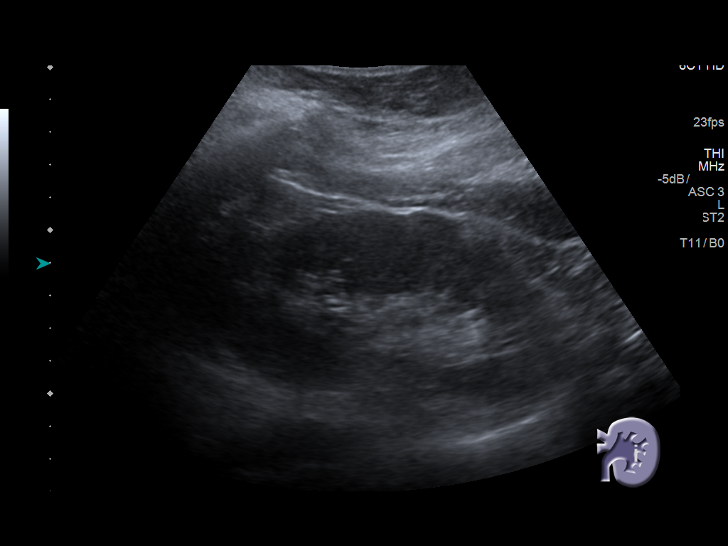
[im 31/47]
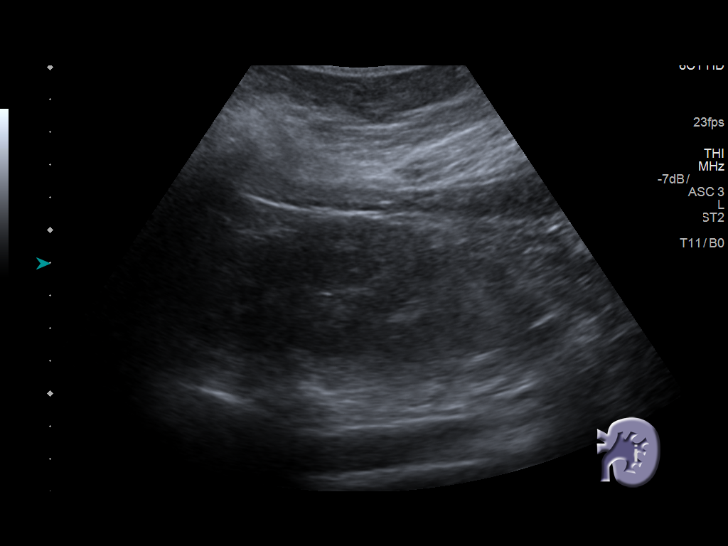
[im 35/47]
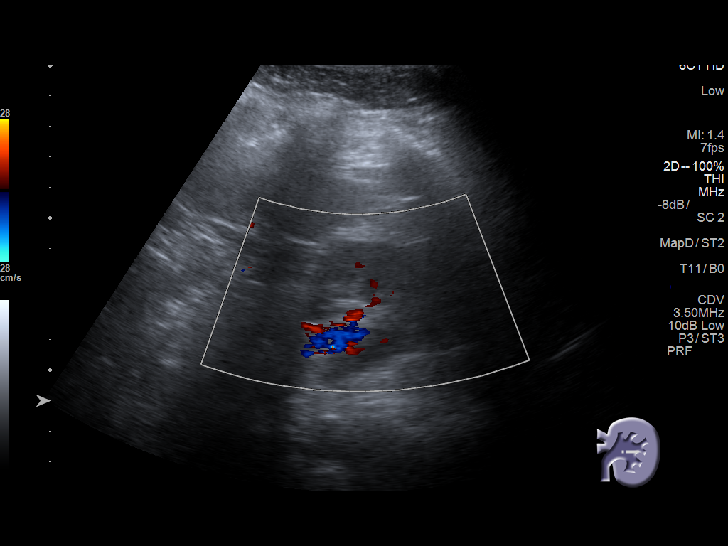
[im 39/47]
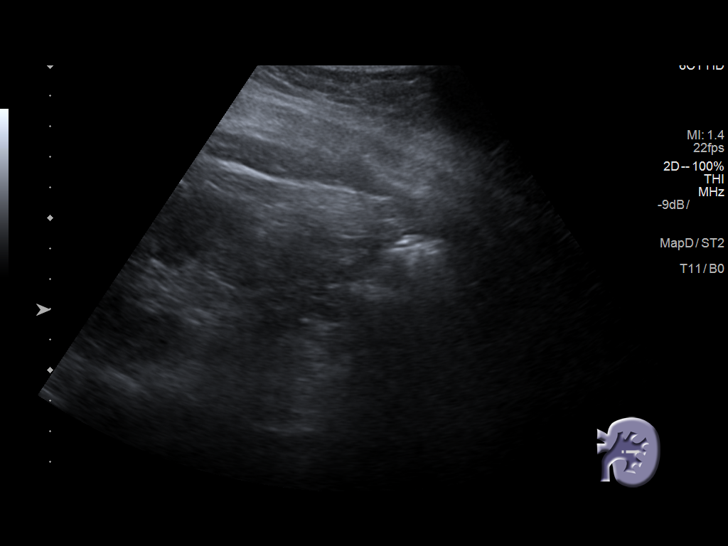
[im 43/47]
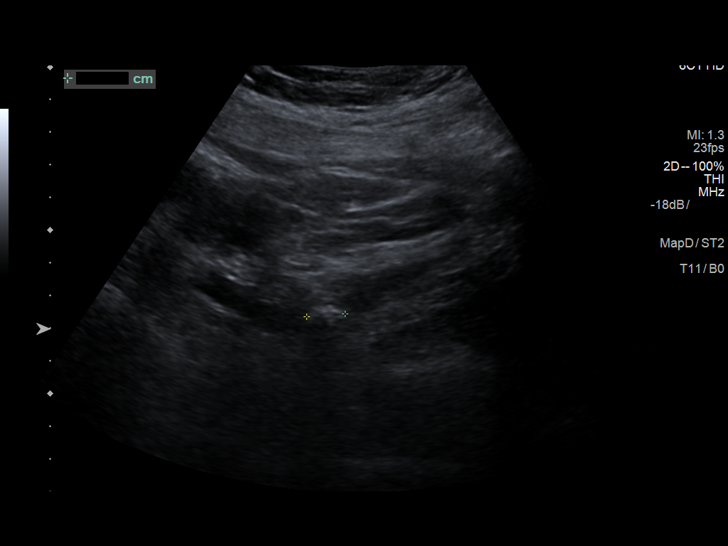
[im 47/47]
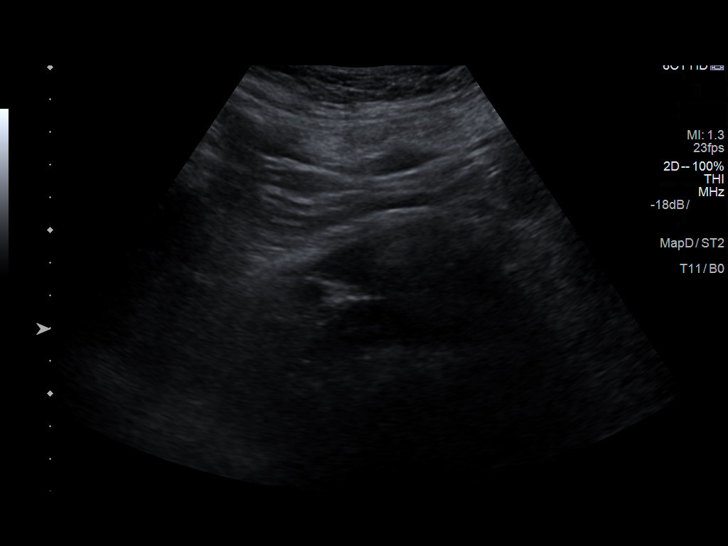

[14 of 25 positions shown; findings below may reference images not displayed]

FINDINGS: Right Kidney:

Length: 13.0 cm.. There is moderate right hydronephrosis present.
The right ureter also appears dilated. The renal parenchyma may be
minimally echogenic.

Left Kidney:

Length: 11.6 cm.. No left hydronephrosis is seen. No echogenic foci
are noted.

Bladder:

The urinary bladder is largely decompressed. The distal left ureter
is not dilated. However the right ureter is dilated and there does
appear to be a 1.2 cm echogenic focus within the distal right ureter
is suspicious for distal right ureteral calculus.
IMPRESSION: 1. Moderate right hydronephrosis and hydroureter apparently due to a
distal right ureteral calculus of 1.2 cm.
2. No left hydronephrosis.
3. Question of slightly echogenic right renal parenchyma.

## 2018-07-15 IMAGING — US US MFM OB LIMITED
1 series · 14 of 28 positions shown · non-contrast
Comparison: none

[Series 1: us mfm ob limited · 14 of 32 slices shown]
[im 2/32]
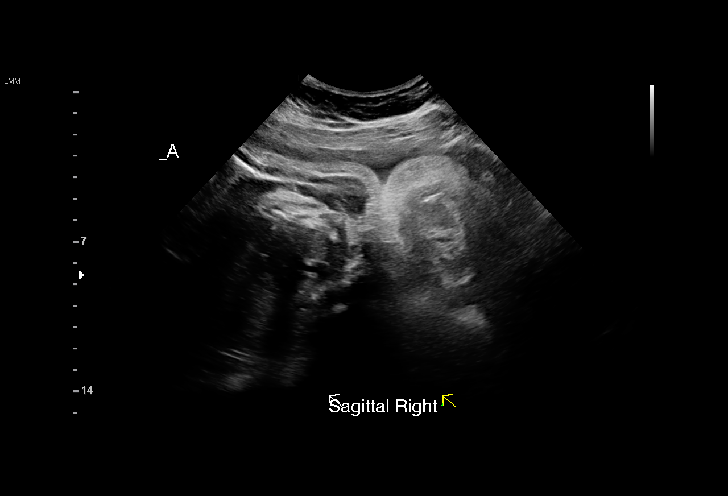
[im 4/32]
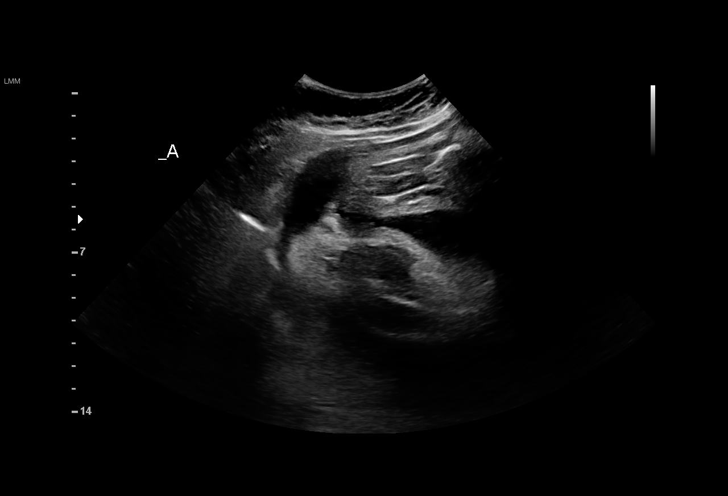
[im 6/32]
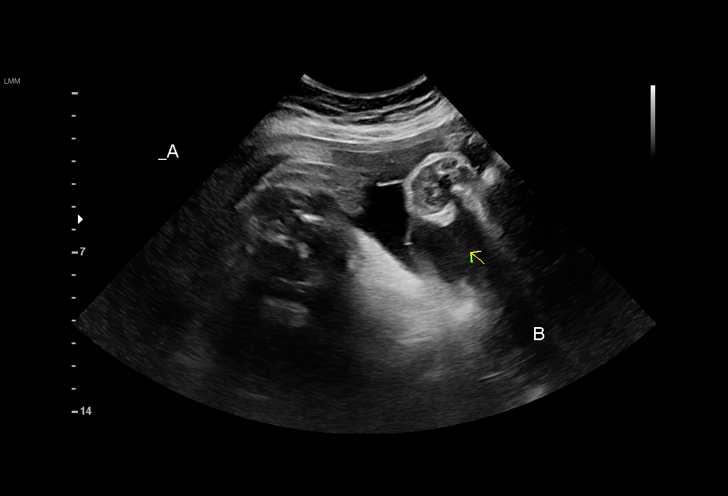
[im 9/32]
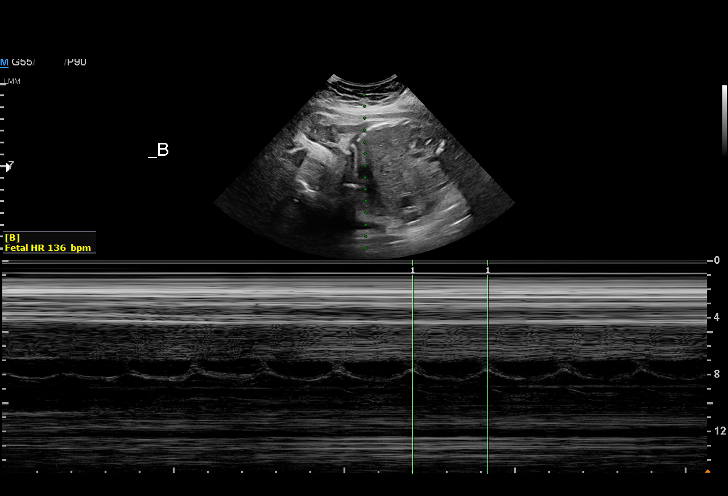
[im 11/32]
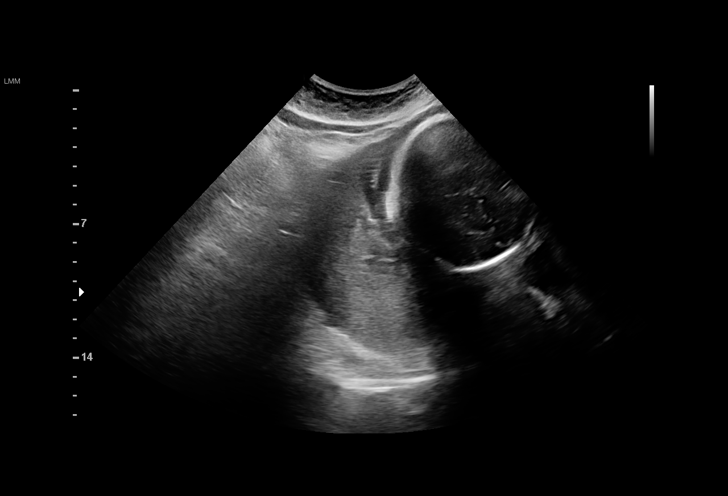
[im 13/32]
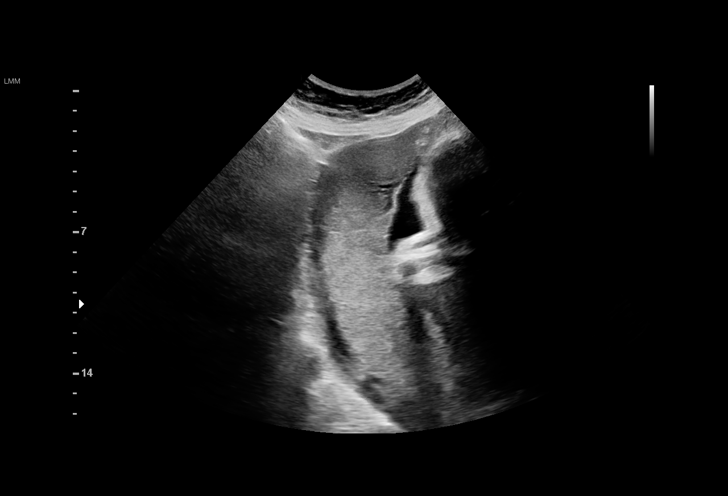
[im 15/32]
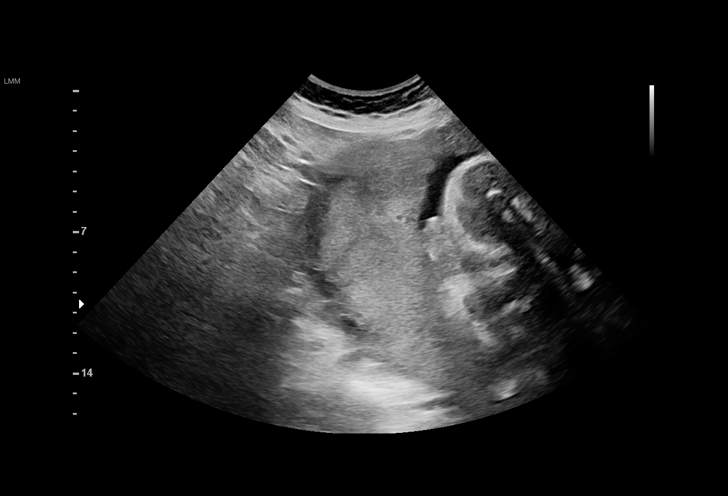
[im 18/32]
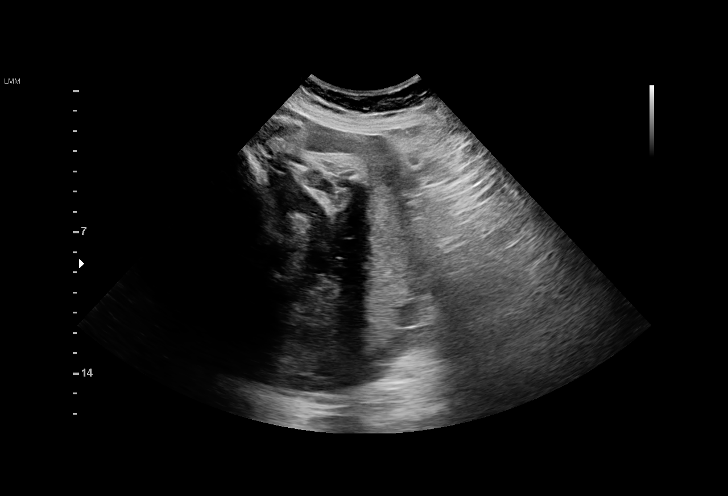
[im 20/32]
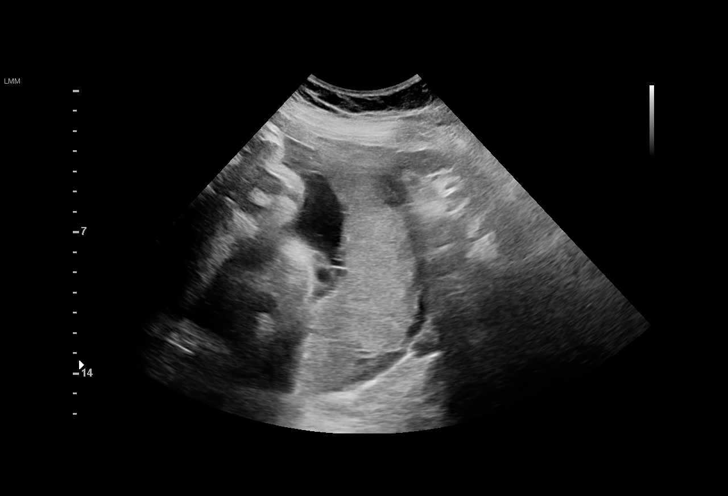
[im 22/32]
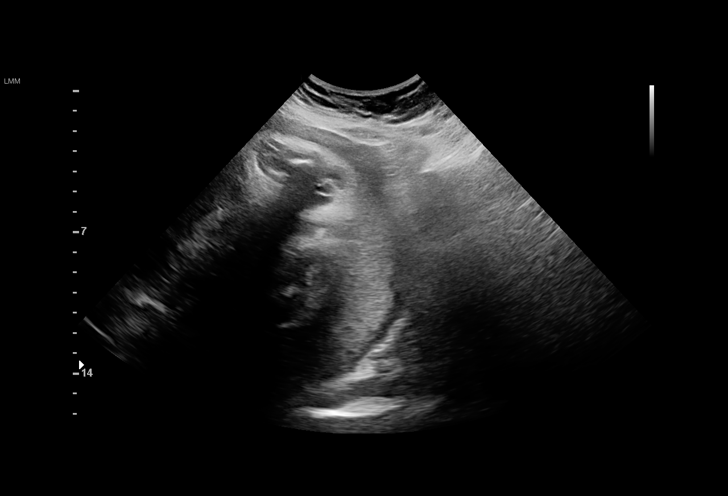
[im 25/32]
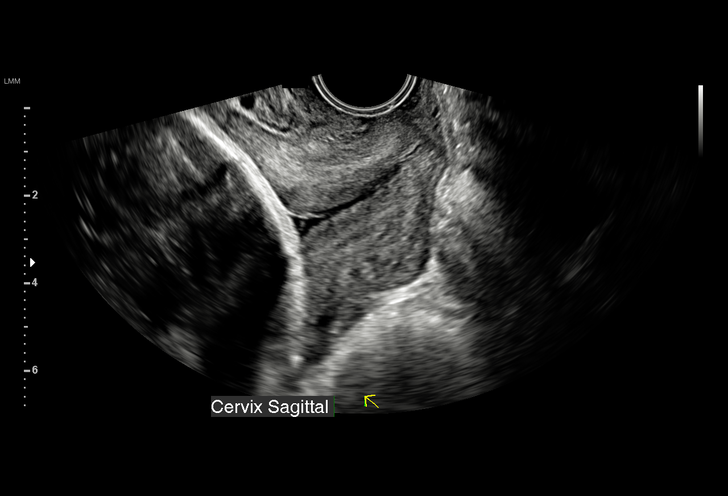
[im 27/32]
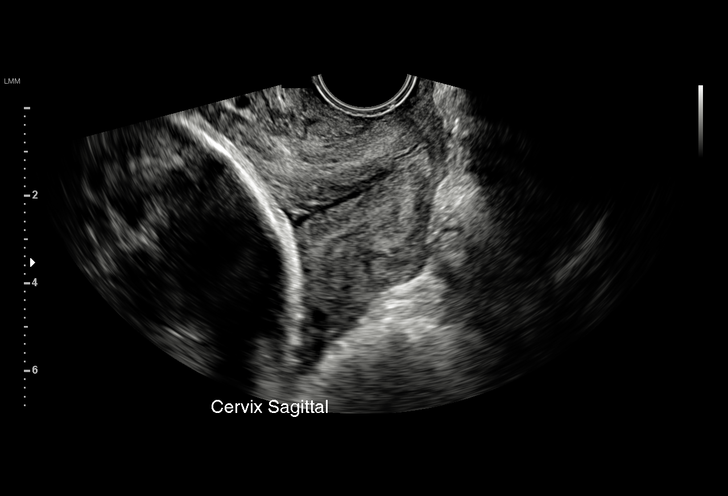
[im 29/32]
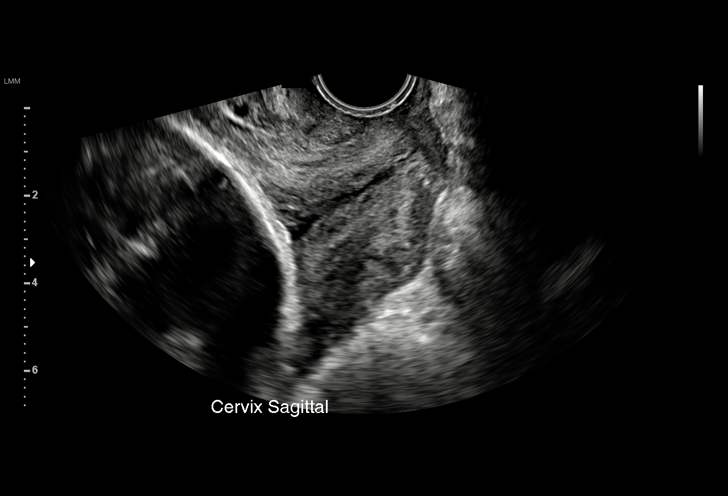
[im 32/32]
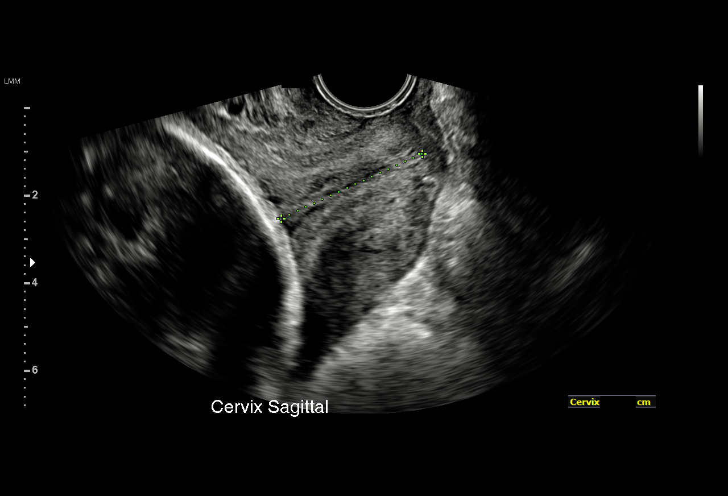

[14 of 28 positions shown; findings below may reference images not displayed]

pm)

[id number]
MAU/Triage

1  SOPHIE LINDSTROM               [id number]      [id number]     [id number]
2  SOPHIE LINDSTROM               [id number]      [id number]     [id number]
Indications

32 weeks gestation of pregnancy
Threatened preterm labor, antepartum
Twin pregnancy, di/di, third trimester
OB History

Gravidity:    1
Fetal Evaluation (Fetus A)

Num Of Fetuses:     2
Fetal Heart         155
Rate(bpm):
Cardiac Activity:   Observed
Fetal Lie:          Maternal right side
Presentation:       Cephalic
Placenta:           Posterior, above cervical os
Membrane Desc:      Dividing Membrane seen

Amniotic Fluid
AFI FV:      Subjectively within normal limits

Largest Pocket(cm)
3.17
Gestational Age (Fetus A)

LMP:           32w 6d        Date:  06/21/15                 EDD:   03/27/16
Best:          32w 6d     Det. By:  LMP  (06/21/15)          EDD:   03/27/16
Fetal Evaluation (Fetus B)

Num Of Fetuses:     2
Fetal Heart         136
Rate(bpm):
Cardiac Activity:   Observed
Fetal Lie:          Maternal left side
Presentation:       Breech
Placenta:           Posterior, above cervical os
Membrane Desc:      Dividing Membrane seen

Amniotic Fluid
AFI FV:      Subjectively within normal limits

Largest Pocket(cm)
2.41
Gestational Age (Fetus B)

LMP:           32w 6d        Date:  06/21/15                 EDD:   03/27/16
Best:          32w 6d     Det. By:  LMP  (06/21/15)          EDD:   03/27/16
Cervix Uterus Adnexa

Cervix
Length:           3.53  cm.
Normal appearance by transvaginal scan

Cul De Sac:   No free fluid seen.
Impression

Diamniotic Dichorionic intrauterine pregnancy at weeks
Presentation is cephalic/breech
Normal amniotic fluid in each sac, with  MVP of 3.2 cm for A
and 2.4 cm for B
There is normal fetal movement and cardiac activity on both
twins
A transvaginal evaluation of the cervix showed lengths from
29-35 mm with no funnelling
Recommendations

No evidence of fetal compromise. the use of transvaginal
ceervical length to predict the likelihood of preterm delivery
has not been validated in multifetal gestation. Similarly, the
use of transvaginal cervical length after 28 weeks is of little or
no utility for the prediction of preterm delivery risk

## 2020-04-08 ENCOUNTER — Other Ambulatory Visit: Payer: Self-pay

## 2020-04-08 ENCOUNTER — Emergency Department
Admission: EM | Admit: 2020-04-08 | Discharge: 2020-04-08 | Disposition: A | Discharge status: Home / Self Care | Payer: Self-pay | Attending: Emergency Medicine | Admitting: Emergency Medicine

## 2020-04-08 ENCOUNTER — Emergency Department: Payer: Self-pay

## 2020-04-08 ENCOUNTER — Encounter: Payer: Self-pay | Admitting: Emergency Medicine

## 2020-04-08 DIAGNOSIS — R10A2 Flank pain, left side: Secondary | ICD-10-CM

## 2020-04-08 DIAGNOSIS — N12 Tubulo-interstitial nephritis, not specified as acute or chronic: Secondary | ICD-10-CM

## 2020-04-08 DIAGNOSIS — R39198 Other difficulties with micturition: Secondary | ICD-10-CM | POA: Insufficient documentation

## 2020-04-08 DIAGNOSIS — R109 Unspecified abdominal pain: Secondary | ICD-10-CM | POA: Insufficient documentation

## 2020-04-08 DIAGNOSIS — Z87442 Personal history of urinary calculi: Secondary | ICD-10-CM | POA: Insufficient documentation

## 2020-04-08 DIAGNOSIS — K219 Gastro-esophageal reflux disease without esophagitis: Secondary | ICD-10-CM | POA: Insufficient documentation

## 2020-04-08 DIAGNOSIS — R6883 Chills (without fever): Secondary | ICD-10-CM | POA: Insufficient documentation

## 2020-04-08 LAB — CBC WITH DIFFERENTIAL/PLATELET
Abs Immature Granulocytes: 0.09 10*3/uL — ABNORMAL HIGH (ref 0.00–0.07)
Basophils Absolute: 0.1 10*3/uL (ref 0.0–0.1)
Basophils Relative: 1 %
Eosinophils Absolute: 0.8 10*3/uL — ABNORMAL HIGH (ref 0.0–0.5)
Eosinophils Relative: 5 %
HCT: 40.6 % (ref 36.0–46.0)
Hemoglobin: 14.2 g/dL (ref 12.0–15.0)
Immature Granulocytes: 1 %
Lymphocytes Relative: 11 %
Lymphs Abs: 1.8 10*3/uL (ref 0.7–4.0)
MCH: 31.1 pg (ref 26.0–34.0)
MCHC: 35 g/dL (ref 30.0–36.0)
MCV: 88.8 fL (ref 80.0–100.0)
Monocytes Absolute: 0.7 10*3/uL (ref 0.1–1.0)
Monocytes Relative: 4 %
Neutro Abs: 13 10*3/uL — ABNORMAL HIGH (ref 1.7–7.7)
Neutrophils Relative %: 78 %
Platelets: 350 10*3/uL (ref 150–400)
RBC: 4.57 MIL/uL (ref 3.87–5.11)
RDW: 12.7 % (ref 11.5–15.5)
WBC: 16.5 10*3/uL — ABNORMAL HIGH (ref 4.0–10.5)
nRBC: 0 % (ref 0.0–0.2)

## 2020-04-08 LAB — COMPREHENSIVE METABOLIC PANEL
ALT: 15 U/L (ref 0–44)
AST: 21 U/L (ref 15–41)
Albumin: 4.2 g/dL (ref 3.5–5.0)
Alkaline Phosphatase: 70 U/L (ref 38–126)
Anion gap: 8 (ref 5–15)
BUN: 13 mg/dL (ref 6–20)
CO2: 22 mmol/L (ref 22–32)
Calcium: 9.5 mg/dL (ref 8.9–10.3)
Chloride: 109 mmol/L (ref 98–111)
Creatinine, Ser: 0.68 mg/dL (ref 0.44–1.00)
GFR, Estimated: >60 mL/min (ref >60)
Glucose, Bld: 112 mg/dL — ABNORMAL HIGH (ref 70–99)
Potassium: 4.1 mmol/L (ref 3.5–5.1)
Sodium: 139 mmol/L (ref 135–145)
Total Bilirubin: 0.7 mg/dL (ref 0.3–1.2)
Total Protein: 7.1 g/dL (ref 6.5–8.1)

## 2020-04-08 LAB — URINALYSIS, COMPLETE (UACMP) WITH MICROSCOPIC
Bilirubin Urine: NEGATIVE
Glucose, UA: NEGATIVE mg/dL
Ketones, ur: NEGATIVE mg/dL
Nitrite: NEGATIVE
Protein, ur: NEGATIVE mg/dL
RBC / HPF: >50 RBC/hpf — ABNORMAL HIGH (ref 0–5)
Specific Gravity, Urine: 1.016 (ref 1.005–1.030)
pH: 5 (ref 5.0–8.0)

## 2020-04-08 LAB — POC URINE PREG, ED: Preg Test, Ur: NEGATIVE

## 2020-04-08 MED ORDER — ONDANSETRON HCL 4 MG/2ML IJ SOLN
4.0000 mg | Freq: Once | INTRAMUSCULAR | Status: AC
  Administered 2020-04-08: 4 mg via INTRAVENOUS
  Filled 2020-04-08: qty 2

## 2020-04-08 MED ORDER — KETOROLAC TROMETHAMINE 30 MG/ML IJ SOLN
30.0000 mg | Freq: Once | INTRAMUSCULAR | Status: AC
  Administered 2020-04-08: 30 mg via INTRAVENOUS
  Filled 2020-04-08: qty 1

## 2020-04-08 MED ORDER — SODIUM CHLORIDE 0.9 % IV SOLN
1.0000 g | Freq: Once | INTRAVENOUS | Status: AC
  Administered 2020-04-08: 1 g via INTRAVENOUS
  Filled 2020-04-08: qty 10

## 2020-04-08 MED ORDER — CEFPODOXIME PROXETIL 200 MG PO TABS: 200.0000 mg | ORAL_TABLET | Freq: Two times a day (BID) | ORAL | 0 refills | Status: AC

## 2020-04-08 MED ORDER — ONDANSETRON 4 MG PO TBDP: 4.0000 mg | ORAL_TABLET | Freq: Four times a day (QID) | ORAL | 0 refills | Status: AC | PRN

## 2020-04-08 MED ORDER — SODIUM CHLORIDE 0.9 % IV BOLUS (SEPSIS)
1000.0000 mL | Freq: Once | INTRAVENOUS | Status: AC
  Administered 2020-04-08: 1000 mL via INTRAVENOUS

## 2020-04-08 NOTE — ED Provider Notes (Signed)
[facility] Emergency Department Provider Note  ____________________________________________   Event Date/Time   First MD Initiated Contact with Patient 04/08/20 0500     (approximate)  I have reviewed the triage vital signs and the nursing notes.   HISTORY  Chief Complaint Flank Pain    HPI Lori [name] is a 35 y.o. female with history of kidney stones who presents to the emergency department complaints of left-sided flank pain, difficulty urinating, nausea and vomiting, hot and cold chills that started around 11 PM last night.  Feels similar to her previous kidney stones.  No dysuria but is having urinary frequency and urgency.  No gross hematuria.  No vaginal bleeding or discharge.  No diarrhea.  Denies abdominal pain.  Denies known fever.  She took half of Vicodin at home with some relief.        Past Medical History:  Diagnosis Date  . GERD (gastroesophageal reflux disease)   . History of shingles    as a child  . Migraines   . Right ureteral stone   . Urgency of urination     Patient Active Problem List   Diagnosis Date Noted  . Postoperative state 03/08/2016  . Twin pregnancy, twins dichorionic and diamniotic 02/06/2016  . Increased nuchal translucency space on fetal ultrasound 09/20/2015  . Chronic cholecystitis 01/25/2011  . Biliary dyskinesia 12/27/2010  . Abdominal pain 12/27/2010  . S/P colonoscopy 12/27/2010  . S/P endoscopy 12/27/2010    Past Surgical History:  Procedure Laterality Date  . CESAREAN SECTION MULTI-GESTATIONAL N/A 03/08/2016   Procedure: CESAREAN SECTION MULTI-GESTATIONAL;  Surgeon: Carrington ClampMichelle Horvath, MD;  Location: [facility];  Service: Obstetrics;  Laterality: N/A;  . CHOLECYSTECTOMY  12/28/2010   Procedure: LAPAROSCOPIC CHOLECYSTECTOMY;  Surgeon: Almond LintFaera Byerly, MD;  Location: [facility];  Service: General;  Laterality: N/A;  . COLONOSCOPY  12/26/2010  . CYSTOSCOPY WITH RETROGRADE PYELOGRAM, URETEROSCOPY  AND STENT PLACEMENT Right 09/21/2016   Procedure: CYSTOSCOPY WITH RIGHT RETROGRADE PYELOGRAM,RIGHT  URETEROSCOPY WITH HOLMIUM LASER LITHOTRIPSY STONE EXTRACTION AND RIGHT STENT PLACEMENT;  Surgeon: Crist FatHerrick, Benjamin W, MD;  [facility] [place];  Service: Urology;  Laterality: Right;  . HOLMIUM LASER APPLICATION Right 09/21/2016   Procedure: HOLMIUM LASER APPLICATION;  Surgeon: Crist FatHerrick, Benjamin W, MD;  [facility] [place];  Service: Urology;  Laterality: Right;  . UPPER GASTROINTESTINAL ENDOSCOPY  12/26/2010  . WISDOM TOOTH EXTRACTION  age 89    Prior to Admission medications   Medication Sig Start Date End Date Taking? Authorizing Provider  acetaminophen (TYLENOL) 500 MG tablet Take 1,000 mg by mouth every 6 (six) hours as needed for mild pain, moderate pain or headache.    [provider]  calcium carbonate (TUMS - DOSED IN MG ELEMENTAL CALCIUM) 500 MG chewable tablet Chew 1 tablet by mouth as needed for indigestion or heartburn.    [provider]  EPINEPHrine (EPIPEN 2-PAK) 0.3 mg/0.3 mL IJ SOAJ injection Inject 0.3 mg into the muscle once as needed (for severe allergic reaction).    [provider]  ibuprofen (ADVIL,MOTRIN) 600 MG tablet Take 1 tablet (600 mg total) by mouth every 6 (six) hours as needed. 08/24/16   Derwood KaplanNanavati, Ankit, MD  Levocetirizine Dihydrochloride (XYZAL ALLERGY 24HR PO) Take 1 tablet by mouth every evening.    [provider]  traMADol (ULTRAM) 50 MG tablet Take 1-2 tablets (50-100 mg total) by mouth every 6 (six) hours as needed for moderate pain. 09/21/16   Berniece SalinesHerrick, Benjamin  W, MD    Allergies Bee venom, Food, Amoxicillin-pot clavulanate, and Penicillins  Family History  Problem Relation Age of Onset  . Hypertension Maternal Grandmother   . Diabetes Maternal Grandfather   . Hyperlipidemia Maternal Grandfather   . Heart disease Maternal Grandfather   . Hypertension Maternal Grandfather   . Stroke  Maternal Grandfather     Social History Social History   Tobacco Use  . Smoking status: Former Smoker    Years: 3.00    Types: Cigarettes    Quit date: 09/20/2008    Years since quitting: 11.5  . Smokeless tobacco: Never Used  Vaping Use  . Vaping Use: Never used  Substance Use Topics  . Alcohol use: No  . Drug use: No    Review of Systems Constitutional: No fever. Eyes: No visual changes. ENT: No sore throat. Cardiovascular: Denies chest pain. Respiratory: Denies shortness of breath. Gastrointestinal: + Nausea and vomiting Genitourinary: Negative for dysuria. Musculoskeletal: Negative for back pain. Skin: Negative for rash. Neurological: Negative for focal weakness or numbness.  ____________________________________________   PHYSICAL EXAM:  VITAL SIGNS: ED Triage Vitals  Enc Vitals Group     BP 04/08/20 0451 (!) 146/87     Pulse Rate 04/08/20 0451 68     Resp 04/08/20 0451 18     Temp 04/08/20 0451 98.9 F (37.2 C)     Temp Source 04/08/20 0451 Oral     SpO2 04/08/20 0451 99 %     Weight 04/08/20 0452 170 lb (77.1 kg)     Height 04/08/20 0452 5\' 4"  (1.626 m)     Head Circumference --      Peak Flow --      Pain Score 04/08/20 0452 8     Pain Loc --      Pain Edu? --      Excl. in GC? --    CONSTITUTIONAL: Alert and oriented and responds appropriately to questions. Well-appearing; well-nourished HEAD: Normocephalic EYES: Conjunctivae clear, pupils appear equal, EOM appear intact ENT: normal nose; moist mucous membranes NECK: Supple, normal ROM CARD: RRR; S1 and S2 appreciated; no murmurs, no clicks, no rubs, no gallops RESP: Normal chest excursion without splinting or tachypnea; breath sounds clear and equal bilaterally; no wheezes, no rhonchi, no rales, no hypoxia or respiratory distress, speaking full sentences ABD/GI: Normal bowel sounds; non-distended; soft, non-tender, no rebound, no guarding, no peritoneal signs, no hepatosplenomegaly BACK: The  back appears normal, no CVA tenderness EXT: Normal ROM in all joints; no deformity noted, no edema; no cyanosis SKIN: Normal color for age and race; warm; no rash on exposed skin NEURO: Moves all extremities equally PSYCH: The patient's mood and manner are appropriate.  ____________________________________________   LABS (all labs ordered are listed, but only abnormal results are displayed)  Labs Reviewed  URINALYSIS, COMPLETE (UACMP) WITH MICROSCOPIC - Abnormal; Notable for the following components:      Result Value   Color, Urine YELLOW (*)    APPearance HAZY (*)    Hgb urine dipstick LARGE (*)    Leukocytes,Ua TRACE (*)    RBC / HPF >50 (*)    Bacteria, UA RARE (*)    All other components within normal limits  CBC WITH DIFFERENTIAL/PLATELET - Abnormal; Notable for the following components:   WBC 16.5 (*)    Neutro Abs 13.0 (*)    Eosinophils Absolute 0.8 (*)    Abs Immature Granulocytes 0.09 (*)    All other components within normal limits  COMPREHENSIVE METABOLIC PANEL - Abnormal; Notable for the following components:   Glucose, Bld 112 (*)    All other components within normal limits  URINE CULTURE  POC URINE PREG, ED   ____________________________________________  EKG  None ____________________________________________  RADIOLOGY I, Jazmarie Biever, personally viewed and evaluated these images (plain radiographs) as part of my medical decision making, as well as reviewing the written report by the radiologist.  ED MD interpretation: No kidney stones appreciated.  Official radiology report(s): CT Renal Stone Study  Result Date: 04/08/2020 CLINICAL DATA:  Left flank pain with nausea, vomiting, and diarrhea. Kidney stone suspected EXAM: CT ABDOMEN AND PELVIS WITHOUT CONTRAST TECHNIQUE: Multidetector CT imaging of the abdomen and pelvis was performed following the standard protocol without IV contrast. COMPARISON:  08/01/2010 FINDINGS: Lower chest:  No contributory  findings. Hepatobiliary: No focal liver abnormality.Cholecystectomy. No bile duct dilatation. Pancreas: Unremarkable. Spleen: Unremarkable. Adrenals/Urinary Tract: Negative adrenals. No hydronephrosis or ureteral stone. Equivocal punctate right renal calculus on coronal reformats. Unremarkable bladder. Stomach/Bowel:  No obstruction. No visible bowel inflammation. Vascular/Lymphatic: No acute vascular abnormality. No mass or adenopathy. Reproductive:No pathologic findings.  IUD in place. Other: No ascites or pneumoperitoneum. Musculoskeletal: No acute abnormalities. IMPRESSION: No acute finding.  No hydronephrosis or ureteral calculus. Electronically Signed   By: Marnee Spring M.D.   On: 04/08/2020 05:56    ____________________________________________   PROCEDURES  Procedure(s) performed (including Critical Care):  Procedures  ____________________________________________   INITIAL IMPRESSION / ASSESSMENT AND PLAN / ED COURSE  As part of my medical decision making, I reviewed the following data within the electronic MEDICAL RECORD NUMBER Nursing notes reviewed and incorporated, Labs reviewed , Old chart reviewed, Notes from prior ED visits and [place] Controlled Substance Database         Patient here with left flank pain and difficulty urinating.  Suspect kidney stone.  Differential also includes UTI, pyelonephritis.  Abdominal exam benign.  Doubt colitis, diverticulitis, appendicitis.  Will give IV fluids, Toradol, Zofran for symptomatic relief and obtain CT renal study.  ED PROGRESS  Patient's labs show leukocytosis of 16,000 with left shift.  Urine shows trace leukocytes, greater than 50 red blood cells, 21-50 white blood cells and rare bacteria.  Urine culture is pending.  Her CT scan shows no acute finding.  Known hydronephrosis or ureteral calculus.  Suspect beginning of pyelonephritis.  Will give IV Rocephin.  She is otherwise nontoxic, well-appearing here.  Plan will be to discharge home  with Cefpodoxime, Zofran.  Recommended alternating Tylenol and Motrin.  She is comfortable with this plan.  Discussed return precautions.   At this time, I do not feel there is any life-threatening condition present. I have reviewed, interpreted and discussed all results (EKG, imaging, lab, urine as appropriate) and exam findings with patient/family. I have reviewed nursing notes and appropriate previous records.  I feel the patient is safe to be discharged home without further emergent workup and can continue workup as an outpatient as needed. Discussed usual and customary return precautions. Patient/family verbalize understanding and are comfortable with this plan.  Outpatient follow-up has been provided as needed. All questions have been answered.  ____________________________________________   FINAL CLINICAL IMPRESSION(S) / ED DIAGNOSES  Final diagnoses:  Left flank pain     ED Discharge Orders    None      *Please note:  AIRA SALLADE was evaluated in Emergency Department on 04/08/2020 for the symptoms described in the history of present illness. She was evaluated in the  context of the global COVID-19 pandemic, which necessitated consideration that the patient might be at risk for infection with the SARS-CoV-2 virus that causes COVID-19. Institutional protocols and algorithms that pertain to the evaluation of patients at risk for COVID-19 are in a state of rapid change based on information released by regulatory bodies including the CDC and federal and state organizations. These policies and algorithms were followed during the patient's care in the ED.  Some ED evaluations and interventions may be delayed as a result of limited staffing during and the pandemic.*   Note:  This document was prepared using Dragon voice recognition software and may include unintentional dictation errors.   Emmersen Garraway, Layla Maw, DO 04/08/20 [phone]

## 2020-04-08 NOTE — ED Triage Notes (Signed)
Patient ambulatory to triage with steady gait, without difficulty or distress noted; pt reports urinary urgency and left flank pain; st hx kidney stones

## 2020-04-08 NOTE — Discharge Instructions (Addendum)
You may alternate Tylenol 1000 mg every 6 hours as needed for pain, fever and Ibuprofen 800 mg every 8 hours as needed for pain, fever.  Please take Ibuprofen with food.  Do not take more than 4000 mg of Tylenol (acetaminophen) in a 24 hour period.  Your CT scan showed no kidney stone today but you do appear to have urinary tract infection/kidney infection.  Please take your antibiotics until complete.
